# Patient Record
Sex: Female | Born: 2000
Health system: Southern US, Community
[De-identification: ages and names within clinical notes are randomized; demographics above are authoritative.]

## PROBLEM LIST (undated history)

## (undated) DIAGNOSIS — E119 Type 2 diabetes mellitus without complications: Secondary | ICD-10-CM

## (undated) HISTORY — PX: OTHER SURGICAL HISTORY: SHX169

## (undated) HISTORY — PX: HAND SURGERY: SHX662

---
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
## 2022-02-14 ENCOUNTER — Encounter (HOSPITAL_COMMUNITY): Payer: Self-pay | Admitting: Emergency Medicine

## 2022-02-14 ENCOUNTER — Ambulatory Visit (HOSPITAL_COMMUNITY)
Admission: EM | Admit: 2022-02-14 | Discharge: 2022-02-14 | Disposition: A | Discharge status: Home / Self Care | Payer: Medicaid Other | Attending: Internal Medicine | Admitting: Internal Medicine

## 2022-02-14 DIAGNOSIS — A084 Viral intestinal infection, unspecified: Secondary | ICD-10-CM | POA: Insufficient documentation

## 2022-02-14 DIAGNOSIS — N3001 Acute cystitis with hematuria: Secondary | ICD-10-CM | POA: Insufficient documentation

## 2022-02-14 LAB — POCT URINALYSIS DIPSTICK, ED / UC
Bilirubin Urine: NEGATIVE
Glucose, UA: NEGATIVE mg/dL
Ketones, ur: NEGATIVE mg/dL
Nitrite: POSITIVE — AB
Protein, ur: 30 mg/dL — AB
Specific Gravity, Urine: 1.015 (ref 1.005–1.030)
Urobilinogen, UA: 1 mg/dL (ref 0.0–1.0)
pH: 7 (ref 5.0–8.0)

## 2022-02-14 LAB — POC URINE PREG, ED: Preg Test, Ur: NEGATIVE

## 2022-02-14 MED ORDER — ONDANSETRON 4 MG PO TBDP
ORAL_TABLET | ORAL | Status: AC
  Filled 2022-02-14: qty 1

## 2022-02-14 MED ORDER — ONDANSETRON 4 MG PO TBDP
4.0000 mg | ORAL_TABLET | Freq: Once | ORAL | Status: AC
  Administered 2022-02-14: 4 mg via ORAL

## 2022-02-14 MED ORDER — CEPHALEXIN 500 MG PO CAPS: 500.0000 mg | ORAL_CAPSULE | Freq: Three times a day (TID) | ORAL | 0 refills | Status: AC

## 2022-02-14 MED ORDER — ONDANSETRON 4 MG PO TBDP: 4.0000 mg | ORAL_TABLET | Freq: Three times a day (TID) | ORAL | 0 refills | Status: DC | PRN

## 2022-02-14 MED ORDER — ACETAMINOPHEN 500 MG PO TABS: 1000.0000 mg | ORAL_TABLET | Freq: Four times a day (QID) | ORAL | 0 refills | Status: DC | PRN

## 2022-02-14 NOTE — Discharge Instructions (Addendum)
Your urine shows that you have a urinary tract infection. Take Keflex antibiotic 3 times daily for the next 7 days.  Take this medicine with food to avoid stomach upset.  I suspect you also have a viral stomach infection as well. This will improve on its own with rest, fluids, and Zofran antinausea medicine.  We gave you a dose of Zofran in the clinic.  Your next dose of Zofran may be at 9 PM tonight if needed.  You may take 4 mg every 8 hours as needed for nausea and vomiting.   Eat a bland diet as discussed over the next 12 to 24 hours then increase your diet as tolerated. Increase your fluid intake to at least 8 cups of water per day to stay well-hydrated.  You may use Tylenol 1000 mg every 6 hours as needed for any pain you may have.  If you develop any new or worsening symptoms or do not improve in the next 2 to 3 days, please return.  If your symptoms are severe, please go to the emergency room.  Follow-up with your primary care provider for further evaluation and management of your symptoms as well as ongoing wellness visits.  I hope you feel better!

## 2022-02-14 NOTE — ED Provider Notes (Signed)
[facility]    CSN: [id number] Arrival date & time: 02/14/22  1059      History   Chief Complaint No chief complaint on file.   HPI Kelli Sellers is a 21 y.o. female.   Patient presents to urgent care for evaluation of nausea, vomiting, and right upper quadrant abdominal pain that she developed on Friday, February 12, 2022 after eating [employer] at approximately 9 PM.  Patient states that she began to vomit at 5 AM on Saturday morning (yesterday) and has vomited approximately 6-7 times in the last 24 hours.  No bloody or bilious vomit reported.  States her emesis was slightly red, but attributes this to intake of red juice the night prior to vomiting.  Denies intake of uncooked or undercooked meat/raw fruits and vegetables.  No known sick contacts.  No recent abdominal surgeries or antibiotics.  Right upper quadrant abdominal pain comes and goes and is an ache/cramp.  Also reporting right-sided flank pain for the last 2 to 3 days.  She has not been able to keep any liquid down without vomiting and has not attempted to drink anything since yesterday.  Last episode of vomiting was overnight last night.  She has not attempted any over-the-counter medications prior to arrival urgent care for her symptoms.  Also reporting urinary urgency but denies urinary frequency, dysuria, and fever/chills.  States she likes to drink a lot of soda at home and has been trying to increase her water intake.  Last normal bowel movement was 2 days ago and without blood or mucus to the stools.  No recent new sexual partners.  Took 2 pregnancy tests at home both were negative.  No other aggravating or relieving factors identified at this time for patient's symptoms.     History reviewed. No pertinent past medical history.  There are no problems to display for this patient.   Past Surgical History:  Procedure Laterality Date   HAND SURGERY     spider bite infection      OB History   No obstetric  history on file.      Home Medications    Prior to Admission medications   Medication Sig Start Date End Date Taking? Authorizing Provider  acetaminophen (TYLENOL) 500 MG tablet Take 2 tablets (1,000 mg total) by mouth every 6 (six) hours as needed. 02/14/22  Yes Carlisle Beers, FNP  cephALEXin (KEFLEX) 500 MG capsule Take 1 capsule (500 mg total) by mouth 3 (three) times daily for 7 days. 02/14/22 02/21/22 Yes Carlisle Beers, FNP  ondansetron (ZOFRAN-ODT) 4 MG disintegrating tablet Take 1 tablet (4 mg total) by mouth every 8 (eight) hours as needed for nausea or vomiting. 02/14/22  Yes Carlisle Beers, FNP  bacitracin ointment Apply 1 application topically 2 (two) times daily. Patient not taking: Reported on 11/01/2021 11/25/19   Moshe Cipro, NP  cetirizine (ZYRTEC ALLERGY) 10 MG tablet Take 1 tablet (10 mg total) by mouth daily. Patient not taking: Reported on 11/01/2021 11/25/19   Moshe Cipro, NP  cyclobenzaprine (FLEXERIL) 5 MG tablet Take 1 tablet (5 mg total) by mouth at bedtime as needed for muscle spasms. Patient not taking: Reported on 11/01/2021 06/11/20   Lorin Picket, NP  ibuprofen (ADVIL) 400 MG tablet Take 1 tablet (400 mg total) by mouth every 6 (six) hours as needed. 06/11/20   Lorin Picket, NP    Family History Family History  Problem Relation Age of Onset   Healthy Mother  Healthy Father     Social History Social History   Tobacco Use   Smoking status: Never    Passive exposure: Yes   Smokeless tobacco: Never  Vaping Use   Vaping Use: Never used  Substance Use Topics   Alcohol use: Never   Drug use: Yes    Types: Marijuana     Allergies   Patient has no known allergies.   Review of Systems Review of Systems Per HPI  Physical Exam Triage Vital Signs ED Triage Vitals  Enc Vitals Group     BP 02/14/22 1213 (!) 137/92     Pulse Rate 02/14/22 1213 67     Resp 02/14/22 1213 16     Temp 02/14/22 1213 98.3 F (36.8  C)     Temp Source 02/14/22 1213 Oral     SpO2 02/14/22 1213 99 %     Weight --      Height --      Head Circumference --      Peak Flow --      Pain Score 02/14/22 1211 6     Pain Loc --      Pain Edu? --      Excl. in GC? --    No data found.  Updated Vital Signs BP (!) 137/92 (BP Location: Right Arm)   Pulse 67   Temp 98.3 F (36.8 C) (Oral)   Resp 16   SpO2 99%   Visual Acuity Right Eye Distance:   Left Eye Distance:   Bilateral Distance:    Right Eye Near:   Left Eye Near:    Bilateral Near:     Physical Exam Vitals and nursing note reviewed.  Constitutional:      Appearance: Normal appearance. She is ill-appearing. She is not toxic-appearing.     Comments: Very pleasant patient sitting on exam in position of comfort table in no acute distress.   HENT:     Head: Normocephalic and atraumatic.     Right Ear: Hearing, tympanic membrane, ear canal and external ear normal.     Left Ear: Hearing, tympanic membrane, ear canal and external ear normal.     Nose: Nose normal.     Mouth/Throat:     Lips: Pink.     Mouth: Mucous membranes are dry.  Eyes:     General: Lids are normal. Vision grossly intact. Gaze aligned appropriately.     Extraocular Movements: Extraocular movements intact.     Conjunctiva/sclera: Conjunctivae normal.     Pupils: Pupils are equal, round, and reactive to light.  Cardiovascular:     Rate and Rhythm: Normal rate and regular rhythm.     Heart sounds: Normal heart sounds, S1 normal and S2 normal.  Pulmonary:     Effort: Pulmonary effort is normal. No respiratory distress.     Breath sounds: Normal breath sounds and air entry.  Abdominal:     General: Bowel sounds are normal.     Palpations: Abdomen is soft.     Tenderness: There is abdominal tenderness. There is right CVA tenderness. There is no left CVA tenderness or guarding.  Musculoskeletal:     Cervical back: Neck supple.  Skin:    General: Skin is warm and dry.     Capillary  Refill: Capillary refill takes less than 2 seconds.     Findings: No rash.  Neurological:     General: No focal deficit present.     Mental Status: She is alert and  oriented to person, place, and time. Mental status is at baseline.     Cranial Nerves: No dysarthria or facial asymmetry.     Motor: No weakness.     Gait: Gait is intact. Gait normal.     Comments: Skin turgor normal.  Psychiatric:        Mood and Affect: Mood normal.        Speech: Speech normal.        Behavior: Behavior normal.        Thought Content: Thought content normal.        Judgment: Judgment normal.      UC Treatments / Results  Labs (all labs ordered are listed, but only abnormal results are displayed) Labs Reviewed  POCT URINALYSIS DIPSTICK, ED / UC - Abnormal; Notable for the following components:      Result Value   Hgb urine dipstick TRACE (*)    Protein, ur 30 (*)    Nitrite POSITIVE (*)    Leukocytes,Ua MODERATE (*)    All other components within normal limits  URINE CULTURE  POC URINE PREG, ED    EKG   Radiology No results found.  Procedures Procedures (including critical care time)  Medications Ordered in UC Medications  ondansetron (ZOFRAN-ODT) disintegrating tablet 4 mg (4 mg Oral Given 02/14/22 1253)    Initial Impression / Assessment and Plan / UC Course  I have reviewed the triage vital signs and the nursing notes.  Pertinent labs & imaging results that were available during my care of the patient were reviewed by me and considered in my medical decision making (see chart for details).   1.  Acute cystitis with hematuria Patient to take Keflex 3 times daily for the next 7 days to treat acute cystitis with hematuria. Urinalysis nitrite positive with moderate leukocytes and trace hematuria/proteinuria.  Advised patient to take this medicine with food to avoid stomach upset.  Considered covering for possible pyelonephritis, but doubt this as patient is afebrile with  hemodynamically stable vital signs.  Keflex antibiotic will also be easier on patient's stomach and be more tolerable for her.  Urine culture sent.  2.  Viral gastroenteritis Symptoms and physical exam are consistent with viral gastroenteritis etiology.  Suspect that this is separate from acute cystitis although considered possible pyelonephritis etiology.  Patient to use Zofran 4 mg every 8 hours as needed for nausea and vomiting.  Advised patient to intake plenty of liquids initially, then increase diet to bland foods, then increase diet to normal.  She may use 1000 mg of Tylenol every 6 hours as needed for any pain she may have to the right upper quadrant abdomen as I believe this may be related to viral stomach illness.  Advised increase intake of Pedialyte and water/decrease intake of sodas and juices to maintain hydration. Urine pregnancy negative. Patient agreeable with this plan.   Discussed physical exam and available lab work findings in clinic with patient.  Counseled patient regarding appropriate use of medications and potential side effects for all medications recommended or prescribed today. Discussed red flag signs and symptoms of worsening condition,when to call the PCP office, return to urgent care, and when to seek higher level of care in the emergency department. Patient verbalizes understanding and agreement with plan. All questions answered. Patient discharged in stable condition.  Final Clinical Impressions(s) / UC Diagnoses   Final diagnoses:  Acute cystitis with hematuria  Viral gastroenteritis     Discharge Instructions  Your urine shows that you have a urinary tract infection. Take Keflex antibiotic 3 times daily for the next 7 days.  Take this medicine with food to avoid stomach upset.  I suspect you also have a viral stomach infection as well. This will improve on its own with rest, fluids, and Zofran antinausea medicine.  We gave you a dose of Zofran in the  clinic.  Your next dose of Zofran may be at 9 PM tonight if needed.  You may take 4 mg every 8 hours as needed for nausea and vomiting.   Eat a bland diet as discussed over the next 12 to 24 hours then increase your diet as tolerated. Increase your fluid intake to at least 8 cups of water per day to stay well-hydrated.  You may use Tylenol 1000 mg every 6 hours as needed for any pain you may have.  If you develop any new or worsening symptoms or do not improve in the next 2 to 3 days, please return.  If your symptoms are severe, please go to the emergency room.  Follow-up with your primary care provider for further evaluation and management of your symptoms as well as ongoing wellness visits.  I hope you feel better!      ED Prescriptions     Medication Sig Dispense Auth. Provider   ondansetron (ZOFRAN-ODT) 4 MG disintegrating tablet Take 1 tablet (4 mg total) by mouth every 8 (eight) hours as needed for nausea or vomiting. 20 tablet Joella Prince M, FNP   cephALEXin (KEFLEX) 500 MG capsule Take 1 capsule (500 mg total) by mouth 3 (three) times daily for 7 days. 21 capsule Joella Prince M, FNP   acetaminophen (TYLENOL) 500 MG tablet Take 2 tablets (1,000 mg total) by mouth every 6 (six) hours as needed. 30 tablet Talbot Grumbling, FNP      PDMP not reviewed this encounter.   Talbot Grumbling, [place] 02/14/22 1347

## 2022-02-14 NOTE — ED Triage Notes (Addendum)
Friday ate [employer], began having RUQ pain, woke up Saturday morning around 5 am vomiting. Felt bad yesterday, vomited throughout the day yesterday. Pain to RUQ has waxes and wanes in an aching, cramping, shooting pain. When she walks the pain turns sharp and gets worse. Has been taking tylenol without relief, no NSAIDs. Has not had a bowel movement since this started. Denies fever, cough, sore throat, nasal congestion, SOB, changes to bladder habits hematuria, dysuria, alcohol use. Reports she hasn't been able to keep down many foods/fluids. States the pain starts in her back near the CVA and radiates around/through to the RUQ, denies pain in between shoulder blades.

## 2022-02-15 LAB — URINE CULTURE

## 2022-02-16 LAB — URINE CULTURE: Culture: >=100000 — AB

## 2022-05-10 ENCOUNTER — Encounter (HOSPITAL_COMMUNITY): Payer: Self-pay

## 2022-05-10 ENCOUNTER — Ambulatory Visit (HOSPITAL_COMMUNITY)
Admission: EM | Admit: 2022-05-10 | Discharge: 2022-05-10 | Disposition: A | Discharge status: Home / Self Care | Payer: Medicaid Other | Attending: Emergency Medicine | Admitting: Emergency Medicine

## 2022-05-10 DIAGNOSIS — B349 Viral infection, unspecified: Secondary | ICD-10-CM

## 2022-05-10 LAB — POC INFLUENZA A AND B ANTIGEN (URGENT CARE ONLY)
INFLUENZA A ANTIGEN, POC: NEGATIVE
INFLUENZA B ANTIGEN, POC: NEGATIVE

## 2022-05-10 MED ORDER — ACETAMINOPHEN 325 MG PO TABS
ORAL_TABLET | ORAL | Status: AC
  Filled 2022-05-10: qty 3

## 2022-05-10 MED ORDER — ONDANSETRON 4 MG PO TBDP: 4.0000 mg | ORAL_TABLET | Freq: Three times a day (TID) | ORAL | 0 refills | Status: AC | PRN

## 2022-05-10 MED ORDER — ONDANSETRON 4 MG PO TBDP
ORAL_TABLET | ORAL | Status: AC
  Filled 2022-05-10: qty 1

## 2022-05-10 MED ORDER — ACETAMINOPHEN 325 MG PO TABS
975.0000 mg | ORAL_TABLET | Freq: Once | ORAL | Status: AC
  Administered 2022-05-10: 975 mg via ORAL

## 2022-05-10 MED ORDER — ONDANSETRON 4 MG PO TBDP
4.0000 mg | ORAL_TABLET | Freq: Once | ORAL | Status: AC
  Administered 2022-05-10: 4 mg via ORAL

## 2022-05-10 NOTE — ED Triage Notes (Signed)
Pt has been feeling cold , body aches , SOB, chills, possible fever to touch , nausea, bilateral ear pain

## 2022-05-10 NOTE — Discharge Instructions (Signed)
You likely have a virus causing symptoms.  I recommend using ibuprofen alternated with tylenol for body aches, chills, fever, sore throat, etc  Drink lots of fluids!  Use the zofran as needed every 6 hours for nausea  It may take a few days for virus to clear

## 2022-05-10 NOTE — ED Provider Notes (Signed)
[facility]    CSN: [id number] Arrival date & time: 05/10/22  1338     History   Chief Complaint Chief Complaint  Patient presents with   Generalized Body Aches   Shortness of Breath   Chills   Fever   Nausea    HPI Kelli Sellers is a 21 y.o. female.  Presents with body aches, headache, chills, nausea that began yesterday Minor scratchy throat starting today Took tylenol last night that helped, no medications today  Nausea when she tries to eat or drink. Denies vomiting No fever but hot to touch last night per mom  No known sick contacts Home covid test negative   History reviewed. No pertinent past medical history.  There are no problems to display for this patient.   Past Surgical History:  Procedure Laterality Date   HAND SURGERY     spider bite infection      OB History   No obstetric history on file.      Home Medications    Prior to Admission medications   Medication Sig Start Date End Date Taking? Authorizing Provider  acetaminophen (TYLENOL) 500 MG tablet Take 2 tablets (1,000 mg total) by mouth every 6 (six) hours as needed. 02/14/22   Carlisle Beers, FNP  bacitracin ointment Apply 1 application topically 2 (two) times daily. Patient not taking: Reported on 11/01/2021 11/25/19   Moshe Cipro, NP  cetirizine (ZYRTEC ALLERGY) 10 MG tablet Take 1 tablet (10 mg total) by mouth daily. Patient not taking: Reported on 11/01/2021 11/25/19   Moshe Cipro, NP  cyclobenzaprine (FLEXERIL) 5 MG tablet Take 1 tablet (5 mg total) by mouth at bedtime as needed for muscle spasms. Patient not taking: Reported on 11/01/2021 06/11/20   Lorin Picket, NP  ibuprofen (ADVIL) 400 MG tablet Take 1 tablet (400 mg total) by mouth every 6 (six) hours as needed. 06/11/20   Haskins, Jaclyn Prime, NP  ondansetron (ZOFRAN-ODT) 4 MG disintegrating tablet Take 1 tablet (4 mg total) by mouth every 8 (eight) hours as needed for up to 3 days for nausea or  vomiting. 05/10/22 05/13/22  Knight Oelkers, Lurena Joiner PA-C    Family History Family History  Problem Relation Age of Onset   Healthy Mother    Healthy Father     Social History Social History   Tobacco Use   Smoking status: Never    Passive exposure: Yes   Smokeless tobacco: Never  Vaping Use   Vaping Use: Never used  Substance Use Topics   Alcohol use: Never   Drug use: Yes    Types: Marijuana     Allergies   Patient has no known allergies.   Review of Systems Review of Systems Per HPI  Physical Exam Triage Vital Signs ED Triage Vitals  Enc Vitals Group     BP 05/10/22 1521 125/71     Pulse Rate 05/10/22 1521 82     Resp 05/10/22 1521 16     Temp 05/10/22 1521 100 F (37.8 C)     Temp Source 05/10/22 1521 Oral     SpO2 05/10/22 1521 98 %     Weight --      Height --      Head Circumference --      Peak Flow --      Pain Score 05/10/22 1523 3     Pain Loc --      Pain Edu? --      Excl. in GC? --  No data found.  Updated Vital Signs BP 125/71 (BP Location: Left Arm)   Pulse 82   Temp 100 F (37.8 C) (Oral)   Resp 16   LMP 04/28/2022   SpO2 98%    Physical Exam Vitals and nursing note reviewed.  Constitutional:      Appearance: She is not toxic-appearing or diaphoretic.     Comments: Shaking on table  HENT:     Mouth/Throat:     Mouth: Mucous membranes are moist.     Pharynx: Oropharynx is clear. No oropharyngeal exudate or posterior oropharyngeal erythema.  Eyes:     Conjunctiva/sclera: Conjunctivae normal.  Cardiovascular:     Rate and Rhythm: Normal rate and regular rhythm.     Pulses: Normal pulses.     Heart sounds: Normal heart sounds.  Pulmonary:     Effort: Pulmonary effort is normal.     Breath sounds: Normal breath sounds.  Abdominal:     Tenderness: There is no abdominal tenderness. There is no guarding.  Lymphadenopathy:     Cervical: No cervical adenopathy.  Skin:    General: Skin is warm and dry.  Neurological:      Mental Status: She is alert and oriented to person, place, and time.      UC Treatments / Results  Labs (all labs ordered are listed, but only abnormal results are displayed) Labs Reviewed  POC INFLUENZA A AND B ANTIGEN (URGENT CARE ONLY)    EKG   Radiology No results found.  Procedures Procedures (including critical care time)  Medications Ordered in UC Medications  acetaminophen (TYLENOL) tablet 975 mg (975 mg Oral Given 05/10/22 1616)  ondansetron (ZOFRAN-ODT) disintegrating tablet 4 mg (4 mg Oral Given 05/10/22 1617)    Initial Impression / Assessment and Plan / UC Course  I have reviewed the triage vital signs and the nursing notes.  Pertinent labs & imaging results that were available during my care of the patient were reviewed by me and considered in my medical decision making (see chart for details).  Offered flu test - mom and patient agree. Resulted negative  Tylenol given for body aches with improvement in symptoms Zofran for nausea resolved symptoms P.o challenge successful Patient feeling better overall. No longer shaking on exam table  Temp 99.2 at discharge  Discussed viral etiology, symptomatic care Return precautions discussed. Patient agrees to plan  Final Clinical Impressions(s) / UC Diagnoses   Final diagnoses:  Viral illness     Discharge Instructions      You likely have a virus causing symptoms.  I recommend using ibuprofen alternated with tylenol for body aches, chills, fever, sore throat, etc  Drink lots of fluids!  Use the zofran as needed every 6 hours for nausea  It may take a few days for virus to clear     ED Prescriptions     Medication Sig Dispense Auth. Provider   ondansetron (ZOFRAN-ODT) 4 MG disintegrating tablet Take 1 tablet (4 mg total) by mouth every 8 (eight) hours as needed for up to 3 days for nausea or vomiting. 9 tablet Devona Holmes, Lurena Joiner, PA-C      PDMP not reviewed this encounter.   Marlow Baars,  [place] 05/10/22 1655

## 2022-05-11 ENCOUNTER — Ambulatory Visit (HOSPITAL_COMMUNITY)
Admission: EM | Admit: 2022-05-11 | Discharge: 2022-05-11 | Disposition: A | Discharge status: Home / Self Care | Payer: Medicaid Other | Attending: Emergency Medicine | Admitting: Emergency Medicine

## 2022-05-11 ENCOUNTER — Encounter (HOSPITAL_COMMUNITY): Payer: Self-pay | Admitting: *Deleted

## 2022-05-11 DIAGNOSIS — N10 Acute tubulo-interstitial nephritis: Secondary | ICD-10-CM | POA: Diagnosis not present

## 2022-05-11 LAB — POCT URINALYSIS DIPSTICK, ED / UC
Glucose, UA: NEGATIVE mg/dL
Ketones, ur: 40 mg/dL — AB
Nitrite: POSITIVE — AB
Protein, ur: >=300 mg/dL — AB
Specific Gravity, Urine: 1.025 (ref 1.005–1.030)
Urobilinogen, UA: 2 mg/dL — ABNORMAL HIGH (ref 0.0–1.0)
pH: 6 (ref 5.0–8.0)

## 2022-05-11 MED ORDER — CEFTRIAXONE SODIUM 500 MG IJ SOLR
500.0000 mg | INTRAMUSCULAR | Status: DC
  Administered 2022-05-11: 500 mg via INTRAMUSCULAR

## 2022-05-11 MED ORDER — LIDOCAINE HCL (PF) 1 % IJ SOLN
INTRAMUSCULAR | Status: AC
  Filled 2022-05-11: qty 2

## 2022-05-11 MED ORDER — SULFAMETHOXAZOLE-TRIMETHOPRIM 800-160 MG PO TABS: 1.0000 | ORAL_TABLET | Freq: Two times a day (BID) | ORAL | 0 refills | Status: AC

## 2022-05-11 MED ORDER — CEFTRIAXONE SODIUM 500 MG IJ SOLR
INTRAMUSCULAR | Status: AC
  Filled 2022-05-11: qty 500

## 2022-05-11 NOTE — ED Provider Notes (Signed)
[facility]    CSN: [id number] Arrival date & time: 05/11/22  1303      History   Chief Complaint No chief complaint on file.   HPI Kelli Sellers is a 21 y.o. female.  Patient reports flank pain that started yesterday.  Patient endorses nausea, chills, body aches, and fever at home.  She reports 1 episode of emesis this morning.  Patient reports being seen at this clinic yesterday and being diagnosed with a viral illness.  Patient reports symptoms have progressively worsened.  Patient denies any dysuria or changes in her urine characteristics.  Patient denies any vaginal discharge. Patient has taken Zofran for nausea with some relief of symptoms.  Patient has taken Tylenol at home with minimal relief of symptoms.   HPI  History reviewed. No pertinent past medical history.  There are no problems to display for this patient.   Past Surgical History:  Procedure Laterality Date   HAND SURGERY     spider bite infection      OB History   No obstetric history on file.      Home Medications    Prior to Admission medications   Medication Sig Start Date End Date Taking? Authorizing Provider  acetaminophen (TYLENOL) 500 MG tablet Take 2 tablets (1,000 mg total) by mouth every 6 (six) hours as needed. 02/14/22  Yes Carlisle Beers, FNP  ibuprofen (ADVIL) 400 MG tablet Take 1 tablet (400 mg total) by mouth every 6 (six) hours as needed. 06/11/20  Yes Haskins, Kaila R, NP  ondansetron (ZOFRAN-ODT) 4 MG disintegrating tablet Take 1 tablet (4 mg total) by mouth every 8 (eight) hours as needed for up to 3 days for nausea or vomiting. 05/10/22 05/13/22 Yes Rising, Lurena Joiner, PA-C  sulfamethoxazole-trimethoprim (BACTRIM DS) 800-160 MG tablet Take 1 tablet by mouth 2 (two) times daily for 7 days. 05/11/22 05/18/22 Yes Debby Freiberg, NP    Family History Family History  Problem Relation Age of Onset   Healthy Mother    Healthy Father     Social History Social  History   Tobacco Use   Smoking status: Never    Passive exposure: Yes   Smokeless tobacco: Never  Vaping Use   Vaping Use: Never used  Substance Use Topics   Alcohol use: Never   Drug use: Yes    Types: Marijuana     Allergies   Patient has no known allergies.   Review of Systems Review of Systems  Constitutional:  Positive for activity change, appetite change, chills, fatigue and fever.  Respiratory: Negative.    Cardiovascular: Negative.   Gastrointestinal:  Positive for abdominal pain, nausea and vomiting. Negative for abdominal distention, constipation and diarrhea.  Genitourinary:  Positive for flank pain. Negative for decreased urine volume, difficulty urinating, dysuria, enuresis, frequency, genital sores, hematuria, menstrual problem, pelvic pain, urgency, vaginal bleeding, vaginal discharge and vaginal pain.  Neurological:  Positive for headaches.     Physical Exam Triage Vital Signs ED Triage Vitals  Enc Vitals Group     BP 05/11/22 1512 121/84     Pulse Rate 05/11/22 1512 83     Resp 05/11/22 1512 18     Temp 05/11/22 1512 99.3 F (37.4 C)     Temp Source 05/11/22 1512 Oral     SpO2 05/11/22 1512 96 %     Weight --      Height --      Head Circumference --  Peak Flow --      Pain Score 05/11/22 1511 6     Pain Loc --      Pain Edu? --      Excl. in GC? --    No data found.  Updated Vital Signs BP 121/84 (BP Location: Right Arm)   Pulse 83   Temp 99.3 F (37.4 C) (Oral)   Resp 18   LMP 04/28/2022   SpO2 96%   Physical Exam Vitals and nursing note reviewed.  Constitutional:      Appearance: Normal appearance. She is ill-appearing.  Abdominal:     General: Abdomen is flat. Bowel sounds are normal. There is no distension. There are no signs of injury.     Palpations: Abdomen is soft. There is no shifting dullness, fluid wave, hepatomegaly, splenomegaly, mass or pulsatile mass.     Tenderness: There is abdominal tenderness in the right  upper quadrant, right lower quadrant and suprapubic area. There is right CVA tenderness. There is no left CVA tenderness, guarding or rebound. Negative signs include Murphy's sign and McBurney's sign.  Neurological:     Mental Status: She is alert.      UC Treatments / Results  Labs (all labs ordered are listed, but only abnormal results are displayed) Labs Reviewed  POCT URINALYSIS DIPSTICK, ED / UC - Abnormal; Notable for the following components:      Result Value   Bilirubin Urine MODERATE (*)    Ketones, ur 40 (*)    Hgb urine dipstick LARGE (*)    Protein, ur >=300 (*)    Urobilinogen, UA 2.0 (*)    Nitrite POSITIVE (*)    Leukocytes,Ua LARGE (*)    All other components within normal limits  URINE CULTURE    EKG   Radiology No results found.  Procedures Procedures (including critical care time)  Medications Ordered in UC Medications  cefTRIAXone (ROCEPHIN) injection 500 mg (500 mg Intramuscular Given 05/11/22 1617)    Initial Impression / Assessment and Plan / UC Course  I have reviewed the triage vital signs and the nursing notes.  Pertinent labs & imaging results that were available during my care of the patient were reviewed by me and considered in my medical decision making (see chart for details).     Patient was evaluated for acute pyelonephritis today.  Due to symptomology with systemic symptoms, physical assessment, and urinalysis etiology appears to be based on urinary tract infection with pyelonephritis .  Urinalysis showed moderate bilirubin, 40 of ketones, large blood, greater than 300 of protein, 2.0 urobilinogen, positive nitrites and large leukocytes. Rocephin given IM in office. Bactrim prescription sent to pharmacy. Patient made aware of treatment regiment and possible side effects.  Patient made aware to follow-up if symptoms not improving with treatment regiment.  Patient made aware of red flag symptoms that would warrant an emergency department  visit.  Patient verbalized understanding of instructions. Final Clinical Impressions(s) / UC Diagnoses   Final diagnoses:  Acute pyelonephritis     Discharge Instructions      We warrant a change in your plan of care, these results will be on your MyChart.   We gave you a Rocephin injection in office today.  Bactrim has been sent to the pharmacy, you will take this medication 2 times a day for the next 7 days.  Please follow-up if symptoms are not improving.   As discussed, if you begin having worsening symptoms at home please go to the nearest  emergency department,      ED Prescriptions     Medication Sig Dispense Auth. Provider   sulfamethoxazole-trimethoprim (BACTRIM DS) 800-160 MG tablet Take 1 tablet by mouth 2 (two) times daily for 7 days. 14 tablet Debby Freiberg, NP      PDMP not reviewed this encounter.   Debby Freiberg, NP 05/11/22 1704

## 2022-05-11 NOTE — Discharge Instructions (Addendum)
We warrant a change in your plan of care, these results will be on your MyChart.   We gave you a Rocephin injection in office today.  Bactrim has been sent to the pharmacy, you will take this medication 2 times a day for the next 7 days.  Please follow-up if symptoms are not improving.   As discussed, if you begin having worsening symptoms at home please go to the nearest emergency department,

## 2022-05-11 NOTE — ED Triage Notes (Signed)
Pt states that she was seen yesterday and has been taking the tylenol with pain relief. She was having the right sided lower abdominal pain yesterday but didn't mention it.

## 2022-05-14 LAB — URINE CULTURE
Culture: >=100000 — AB
Special Requests: NORMAL

## 2022-09-21 ENCOUNTER — Encounter (HOSPITAL_COMMUNITY): Payer: Self-pay

## 2022-09-21 ENCOUNTER — Emergency Department (HOSPITAL_COMMUNITY)
Admission: EM | Admit: 2022-09-21 | Discharge: 2022-09-21 | Disposition: A | Discharge status: Home / Self Care | Payer: Medicaid Other | Attending: Emergency Medicine | Admitting: Emergency Medicine

## 2022-09-21 ENCOUNTER — Other Ambulatory Visit: Payer: Self-pay

## 2022-09-21 DIAGNOSIS — R112 Nausea with vomiting, unspecified: Secondary | ICD-10-CM | POA: Diagnosis not present

## 2022-09-21 LAB — BASIC METABOLIC PANEL
Anion gap: 7 (ref 5–15)
BUN: 7 mg/dL (ref 6–20)
CO2: 25 mmol/L (ref 22–32)
Calcium: 8.8 mg/dL — ABNORMAL LOW (ref 8.9–10.3)
Chloride: 105 mmol/L (ref 98–111)
Creatinine, Ser: 0.69 mg/dL (ref 0.44–1.00)
GFR, Estimated: >60 mL/min (ref >60)
Glucose, Bld: 92 mg/dL (ref 70–99)
Potassium: 3.3 mmol/L — ABNORMAL LOW (ref 3.5–5.1)
Sodium: 137 mmol/L (ref 135–145)

## 2022-09-21 LAB — CBC WITH DIFFERENTIAL/PLATELET
Abs Immature Granulocytes: 0.02 10*3/uL (ref 0.00–0.07)
Basophils Absolute: 0 10*3/uL (ref 0.0–0.1)
Basophils Relative: 0 %
Eosinophils Absolute: 0.1 10*3/uL (ref 0.0–0.5)
Eosinophils Relative: 1 %
HCT: 36.8 % (ref 36.0–46.0)
Hemoglobin: 12.1 g/dL (ref 12.0–15.0)
Immature Granulocytes: 0 %
Lymphocytes Relative: 18 %
Lymphs Abs: 1.3 10*3/uL (ref 0.7–4.0)
MCH: 28.5 pg (ref 26.0–34.0)
MCHC: 32.9 g/dL (ref 30.0–36.0)
MCV: 86.8 fL (ref 80.0–100.0)
Monocytes Absolute: 0.4 10*3/uL (ref 0.1–1.0)
Monocytes Relative: 5 %
Neutro Abs: 5.3 10*3/uL (ref 1.7–7.7)
Neutrophils Relative %: 76 %
Platelets: 231 10*3/uL (ref 150–400)
RBC: 4.24 MIL/uL (ref 3.87–5.11)
RDW: 12.9 % (ref 11.5–15.5)
WBC: 7.1 10*3/uL (ref 4.0–10.5)
nRBC: 0 % (ref 0.0–0.2)

## 2022-09-21 LAB — LIPASE, BLOOD: Lipase: 23 U/L (ref 11–51)

## 2022-09-21 MED ORDER — ONDANSETRON HCL 4 MG PO TABS: 4.0000 mg | ORAL_TABLET | Freq: Three times a day (TID) | ORAL | 0 refills | Status: DC | PRN

## 2022-09-21 MED ORDER — ONDANSETRON HCL 4 MG/2ML IJ SOLN
4.0000 mg | Freq: Once | INTRAMUSCULAR | Status: AC
  Administered 2022-09-21: 4 mg via INTRAVENOUS
  Filled 2022-09-21: qty 2

## 2022-09-21 MED ORDER — SODIUM CHLORIDE 0.9 % IV BOLUS
1000.0000 mL | Freq: Once | INTRAVENOUS | Status: AC
  Administered 2022-09-21: 1000 mL via INTRAVENOUS

## 2022-09-21 NOTE — ED Provider Notes (Signed)
[place] EMERGENCY DEPARTMENT AT [facility] Provider Note   CSN: [id number] Arrival date & time: 09/21/22  0455     History  Chief Complaint  Patient presents with   Vomiting   Abdominal Pain    Kelli Sellers is a 22 y.o. female.  Patient presents to the emergency department complaining of nausea and vomiting that began a few hours prior to arrival.  She states she has vomited approximate 4-5 times.  She complains of generalized abdominal discomfort.  She denies diarrhea, vaginal discharge, dysuria, shortness of breath, chest pain, fever.  She is here with her boyfriend and 1 other person who both have similar symptoms.  Past medical history noncontributory  HPI     Home Medications Prior to Admission medications   Medication Sig Start Date End Date Taking? Authorizing Provider  ondansetron (ZOFRAN) 4 MG tablet Take 1 tablet (4 mg total) by mouth every 8 (eight) hours as needed for nausea or vomiting. 09/21/22  Yes Dorothyann Peng, PA-C  acetaminophen (TYLENOL) 500 MG tablet Take 2 tablets (1,000 mg total) by mouth every 6 (six) hours as needed. 02/14/22   Talbot Grumbling, FNP  ibuprofen (ADVIL) 400 MG tablet Take 1 tablet (400 mg total) by mouth every 6 (six) hours as needed. 06/11/20   Griffin Basil, NP      Allergies    Patient has no known allergies.    Review of Systems   Review of Systems  Physical Exam Updated Vital Signs BP 118/73   Pulse 61   Temp 98.2 F (36.8 C) (Oral)   Resp 18   LMP 08/31/2022 (Approximate)   SpO2 100%  Physical Exam Vitals and nursing note reviewed.  Constitutional:      General: She is not in acute distress.    Appearance: She is well-developed.  HENT:     Head: Normocephalic and atraumatic.  Eyes:     Conjunctiva/sclera: Conjunctivae normal.  Cardiovascular:     Rate and Rhythm: Normal rate and regular rhythm.     Heart sounds: No murmur heard. Pulmonary:     Effort: Pulmonary effort is normal. No  respiratory distress.     Breath sounds: Normal breath sounds.  Abdominal:     Palpations: Abdomen is soft.     Tenderness: There is no abdominal tenderness.  Musculoskeletal:        General: No swelling.     Cervical back: Neck supple.  Skin:    General: Skin is warm and dry.     Capillary Refill: Capillary refill takes less than 2 seconds.  Neurological:     Mental Status: She is alert.  Psychiatric:        Mood and Affect: Mood normal.     ED Results / Procedures / Treatments   Labs (all labs ordered are listed, but only abnormal results are displayed) Labs Reviewed  BASIC METABOLIC PANEL - Abnormal; Notable for the following components:      Result Value   Potassium 3.3 (*)    Calcium 8.8 (*)    All other components within normal limits  CBC WITH DIFFERENTIAL/PLATELET  LIPASE, BLOOD  URINALYSIS, ROUTINE W REFLEX MICROSCOPIC    EKG None  Radiology No results found.  Procedures Procedures    Medications Ordered in ED Medications  sodium chloride 0.9 % bolus 1,000 mL (1,000 mLs Intravenous New Bag/Given 09/21/22 0539)  ondansetron (ZOFRAN) injection 4 mg (4 mg Intravenous Given 09/21/22 0538)    ED Course/  Medical Decision Making/ A&P                             Medical Decision Making Amount and/or Complexity of Data Reviewed Labs: ordered.  Risk Prescription drug management.   This patient presents to the ED for concern of nausea, vomiting, this involves an extensive number of treatment options, and is a complaint that carries with it a high risk of complications and morbidity.  The differential diagnosis includes a viral infection, gastritis, cholecystitis, appendicitis, others   Co morbidities that complicate the patient evaluation  None   Additional history obtained:   External records from outside source obtained and reviewed including urgent care notes from November of this past year when the patient was diagnosed with acute  pyelonephritis   Lab Tests:  I Ordered, and personally interpreted labs.  The pertinent results include:  Labs unremarkable   Imaging Studies ordered:  The patient has no abdominal tenderness on palpation.  No signs of surgical/acute abdomen.  I see no indication for abdominal imaging at this time  Problem List / ED Course / Critical interventions / Medication management   I ordered medication including saline for fluid resuscitation, Zofran for nausea Reevaluation of the patient after these medicines showed that the patient improved I have reviewed the patients home medicines and have made adjustments as needed   Social Determinants of Health:  Patient has Medicaid for primary insurance   Test / Admission - Considered:  Patient has symptoms consistent with a viral illness, especially in the setting of multiple household members with similar symptoms.  Plan to discharge with prescription for Zofran.  No abdominal tenderness to suggest surgical abdomen/acute abdomen.  No sepsis.  Vitals are normal.        Final Clinical Impression(s) / ED Diagnoses Final diagnoses:  Nausea and vomiting, unspecified vomiting type    Rx / DC Orders ED Discharge Orders          Ordered    ondansetron (ZOFRAN) 4 MG tablet  Every 8 hours PRN        09/21/22 [place], Maurion Walkowiak B, PA-C 09/21/22 0633    [place], Jenny Reichmann, MD 09/21/22 [phone]

## 2022-09-21 NOTE — ED Notes (Signed)
Patient unable to give urine sample at this time

## 2022-09-21 NOTE — ED Notes (Signed)
Pt verbalized understanding to discharge instructions. Pt ambulated from ED with steady gait.

## 2022-09-21 NOTE — Discharge Instructions (Addendum)
You were evaluated today for nausea and vomiting.  This seems likely to be due to a viral illness.  Please take the prescribed medication for nausea.  Please drink fluids as you are able.

## 2022-09-21 NOTE — ED Triage Notes (Signed)
Pt c/o n/v that started a few hours. States that she has vomited 4-5 times. Also c/o generalized abdominal pain. Denies meds PTA. States that her boyfriend is having similar symptoms and she thought it was from the [employer] that they ate yesterday.

## 2022-12-23 ENCOUNTER — Ambulatory Visit (HOSPITAL_COMMUNITY)
Admission: EM | Admit: 2022-12-23 | Discharge: 2022-12-23 | Disposition: A | Discharge status: Home / Self Care | Payer: Medicaid Other | Attending: Internal Medicine | Admitting: Internal Medicine

## 2022-12-23 ENCOUNTER — Other Ambulatory Visit: Payer: Self-pay

## 2022-12-23 ENCOUNTER — Encounter (HOSPITAL_COMMUNITY): Payer: Self-pay | Admitting: Emergency Medicine

## 2022-12-23 DIAGNOSIS — A084 Viral intestinal infection, unspecified: Secondary | ICD-10-CM | POA: Diagnosis not present

## 2022-12-23 MED ORDER — ONDANSETRON 4 MG PO TBDP: 4.0000 mg | ORAL_TABLET | Freq: Three times a day (TID) | ORAL | 0 refills | Status: DC | PRN

## 2022-12-23 MED ORDER — ONDANSETRON 4 MG PO TBDP
4.0000 mg | ORAL_TABLET | Freq: Once | ORAL | Status: AC
  Administered 2022-12-23: 4 mg via ORAL

## 2022-12-23 MED ORDER — ONDANSETRON 4 MG PO TBDP
ORAL_TABLET | ORAL | Status: AC
  Filled 2022-12-23: qty 1

## 2022-12-23 NOTE — ED Triage Notes (Addendum)
Reports abdominal pain started in the middle of the night.  Patient complains of nausea and vomiting.  Patient denies diarrhea.  Patient reports 5 episodes of vomiting today.   Patient has not taken any medications.  Has been trying to drink water.  Patient is texting someone on phone during intake process.  Reports pain is left lower abdomen and past normal BM was 3 days ago

## 2022-12-23 NOTE — Discharge Instructions (Addendum)
Your symptoms sound like viral gastroenteritis Take zofran as needed for nausea or vomiting, you place this medication under the tongue. Take small sips of water, pedialyte, gatorade until tolerating. Eat BLAND foods - bananas, rice, apples, toast, boiled chicken, saltine crackers. Avoid spicy or greasy foods.  If you develop loose stools, try to remain hydrated, but avoid OTC antidiarrheals. Pepto bismol or Gas-x can help with stomach cramping.

## 2022-12-23 NOTE — ED Provider Notes (Signed)
[facility]    CSN: [id number] Arrival date & time: 12/23/22  1700      History   Chief Complaint Chief Complaint  Patient presents with   Abdominal Pain    HPI Kelli Sellers is a 22 y.o. female.   Pleasant 22 year old female presents today due to concerns of nausea and vomiting.  She also reports generalized abdominal discomfort and cramping.  Around 6 PM last evening she states she ate a cheeseburger from [employer].  Around 2 AM in the morning, she was woken up with cramping abdominal pain and vomiting.  Since 2 AM this morning, she states she has vomited 6 times. She is nauseated in the room, but zofran helped with some improvement. Denies pelvic pain, vaginal discharge, urinary frequency, dysuria. Pt denies constipation or diarrhea. LNMP one week ago. Denies fever or systemic symptoms.   Abdominal Pain   History reviewed. No pertinent past medical history.  There are no problems to display for this patient.   Past Surgical History:  Procedure Laterality Date   HAND SURGERY     spider bite infection      OB History   No obstetric history on file.      Home Medications    Prior to Admission medications   Medication Sig Start Date End Date Taking? Authorizing Provider  ondansetron (ZOFRAN-ODT) 4 MG disintegrating tablet Take 1 tablet (4 mg total) by mouth every 8 (eight) hours as needed for nausea or vomiting. 12/23/22  Yes Carmisha Larusso L, PA  acetaminophen (TYLENOL) 500 MG tablet Take 2 tablets (1,000 mg total) by mouth every 6 (six) hours as needed. 02/14/22   Carlisle Beers, FNP  ibuprofen (ADVIL) 400 MG tablet Take 1 tablet (400 mg total) by mouth every 6 (six) hours as needed. 06/11/20   Lorin Picket, NP    Family History Family History  Problem Relation Age of Onset   Healthy Mother    Healthy Father     Social History Social History   Tobacco Use   Smoking status: Never    Passive exposure: Yes   Smokeless tobacco: Never   Vaping Use   Vaping Use: Never used  Substance Use Topics   Alcohol use: Never   Drug use: Yes    Types: Marijuana     Allergies   Patient has no known allergies.   Review of Systems Review of Systems  Gastrointestinal:  Positive for abdominal pain.  As per HPI   Physical Exam Triage Vital Signs ED Triage Vitals  Enc Vitals Group     BP 12/23/22 1746 126/82     Pulse Rate 12/23/22 1746 66     Resp 12/23/22 1746 18     Temp 12/23/22 1746 98.4 F (36.9 C)     Temp Source 12/23/22 1746 Oral     SpO2 12/23/22 1746 95 %     Weight --      Height --      Head Circumference --      Peak Flow --      Pain Score 12/23/22 1743 10     Pain Loc --      Pain Edu? --      Excl. in GC? --    No data found.  Updated Vital Signs BP 126/82 (BP Location: Left Arm)   Pulse 66   Temp 98.4 F (36.9 C) (Oral)   Resp 18   LMP 12/09/2022   SpO2 95%   Visual  Acuity Right Eye Distance:   Left Eye Distance:   Bilateral Distance:    Right Eye Near:   Left Eye Near:    Bilateral Near:     Physical Exam Vitals and nursing note reviewed.  Constitutional:      General: She is not in acute distress.    Appearance: She is well-developed and normal weight. She is not ill-appearing, toxic-appearing or diaphoretic.     Comments: Pt resting on bed, smiling, NAD  HENT:     Head: Normocephalic and atraumatic.     Mouth/Throat:     Mouth: Mucous membranes are moist.     Pharynx: Oropharynx is clear. No pharyngeal swelling or oropharyngeal exudate.  Eyes:     General: No scleral icterus.    Extraocular Movements: Extraocular movements intact.     Pupils: Pupils are equal, round, and reactive to light.  Cardiovascular:     Rate and Rhythm: Normal rate and regular rhythm.     Heart sounds: Normal heart sounds. No murmur heard.    No friction rub. No gallop.  Pulmonary:     Effort: Pulmonary effort is normal. No respiratory distress.     Breath sounds: Normal breath sounds. No  stridor. No wheezing, rhonchi or rales.  Chest:     Chest wall: No tenderness.  Abdominal:     General: Abdomen is flat. Bowel sounds are normal. There is no distension.     Palpations: Abdomen is soft. There is no hepatomegaly or splenomegaly.     Tenderness: There is generalized abdominal tenderness (mild generalized cramping per pt; No rebound tenderness or signs of acute abdomen). There is no right CVA tenderness, left CVA tenderness, guarding or rebound. Negative signs include Murphy's sign, Rovsing's sign, McBurney's sign and psoas sign.     Hernia: No hernia is present.  Skin:    General: Skin is warm and dry.     Capillary Refill: Capillary refill takes less than 2 seconds.     Coloration: Skin is not cyanotic, jaundiced or mottled.     Findings: No erythema or rash.  Neurological:     General: No focal deficit present.     Mental Status: She is alert and oriented to person, place, and time.  Psychiatric:        Behavior: Behavior normal.      UC Treatments / Results  Labs (all labs ordered are listed, but only abnormal results are displayed) Labs Reviewed - No data to display  EKG   Radiology No results found.  Procedures Procedures (including critical care time)  Medications Ordered in UC Medications  ondansetron (ZOFRAN-ODT) disintegrating tablet 4 mg (4 mg Oral Given 12/23/22 1750)    Initial Impression / Assessment and Plan / UC Course  I have reviewed the triage vital signs and the nursing notes.  Pertinent labs & imaging results that were available during my care of the patient were reviewed by me and considered in my medical decision making (see chart for details).     VGE - pt with n/v since 2am after eating at [facility]. Some mild abdominal cramping which improved with zofran use. Pt denies red flag s/sx. Appears non-toxic, VSS. Discussed BRAT diet and other supportive measures. Pt with moist mucous membranes and no skin tenting. RTC/ER precautions  discussed.    Final Clinical Impressions(s) / UC Diagnoses   Final diagnoses:  Viral gastroenteritis     Discharge Instructions      Your symptoms sound like viral gastroenteritis  Take zofran as needed for nausea or vomiting, you place this medication under the tongue. Take small sips of water, pedialyte, gatorade until tolerating. Eat BLAND foods - bananas, rice, apples, toast, boiled chicken, saltine crackers. Avoid spicy or greasy foods.  If you develop loose stools, try to remain hydrated, but avoid OTC antidiarrheals. Pepto bismol or Gas-x can help with stomach cramping.     ED Prescriptions     Medication Sig Dispense Auth. Provider   ondansetron (ZOFRAN-ODT) 4 MG disintegrating tablet Take 1 tablet (4 mg total) by mouth every 8 (eight) hours as needed for nausea or vomiting. 20 tablet Nakyla Bracco L, [place]      PDMP not reviewed this encounter.   Maretta Bees, [place] 12/23/22 2052

## 2023-06-07 ENCOUNTER — Encounter (HOSPITAL_COMMUNITY): Payer: Self-pay | Admitting: Emergency Medicine

## 2023-10-15 ENCOUNTER — Ambulatory Visit (HOSPITAL_COMMUNITY): Payer: Self-pay

## 2023-10-16 ENCOUNTER — Ambulatory Visit (HOSPITAL_COMMUNITY): Payer: Self-pay

## 2023-11-07 ENCOUNTER — Other Ambulatory Visit: Payer: Self-pay

## 2023-11-07 ENCOUNTER — Emergency Department (HOSPITAL_COMMUNITY)

## 2023-11-07 ENCOUNTER — Encounter (HOSPITAL_COMMUNITY): Payer: Self-pay | Admitting: *Deleted

## 2023-11-07 ENCOUNTER — Emergency Department (HOSPITAL_COMMUNITY)
Admission: EM | Admit: 2023-11-07 | Discharge: 2023-11-07 | Disposition: A | Discharge status: Home / Self Care | Attending: Student | Admitting: Student

## 2023-11-07 DIAGNOSIS — Y92410 Unspecified street and highway as the place of occurrence of the external cause: Secondary | ICD-10-CM | POA: Insufficient documentation

## 2023-11-07 DIAGNOSIS — M542 Cervicalgia: Secondary | ICD-10-CM | POA: Diagnosis not present

## 2023-11-07 DIAGNOSIS — R519 Headache, unspecified: Secondary | ICD-10-CM | POA: Insufficient documentation

## 2023-11-07 DIAGNOSIS — M25561 Pain in right knee: Secondary | ICD-10-CM | POA: Insufficient documentation

## 2023-11-07 DIAGNOSIS — R079 Chest pain, unspecified: Secondary | ICD-10-CM | POA: Diagnosis not present

## 2023-11-07 MED ORDER — NAPROXEN 500 MG PO TABS: 500.0000 mg | ORAL_TABLET | Freq: Two times a day (BID) | ORAL | 0 refills | Status: DC

## 2023-11-07 MED ORDER — METHOCARBAMOL 500 MG PO TABS: 500.0000 mg | ORAL_TABLET | Freq: Two times a day (BID) | ORAL | 0 refills | Status: DC
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
## 2023-11-07 NOTE — Discharge Instructions (Signed)
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
## 2023-12-02 ENCOUNTER — Encounter (HOSPITAL_COMMUNITY): Payer: Self-pay

## 2023-12-02 ENCOUNTER — Ambulatory Visit (HOSPITAL_COMMUNITY)
Admission: EM | Admit: 2023-12-02 | Discharge: 2023-12-02 | Disposition: A | Discharge status: Home / Self Care | Attending: Internal Medicine | Admitting: Internal Medicine

## 2023-12-02 DIAGNOSIS — Z3202 Encounter for pregnancy test, result negative: Secondary | ICD-10-CM | POA: Insufficient documentation

## 2023-12-02 DIAGNOSIS — N921 Excessive and frequent menstruation with irregular cycle: Secondary | ICD-10-CM | POA: Diagnosis present

## 2023-12-02 LAB — BASIC METABOLIC PANEL WITH GFR
Anion gap: 12 (ref 5–15)
BUN: 6 mg/dL (ref 6–20)
CO2: 25 mmol/L (ref 22–32)
Calcium: 9.4 mg/dL (ref 8.9–10.3)
Chloride: 103 mmol/L (ref 98–111)
Creatinine, Ser: 0.71 mg/dL (ref 0.44–1.00)
GFR, Estimated: >60 mL/min (ref >60)
Glucose, Bld: 81 mg/dL (ref 70–99)
Potassium: 3.8 mmol/L (ref 3.5–5.1)
Sodium: 140 mmol/L (ref 135–145)

## 2023-12-02 LAB — CBC
HCT: 26.9 % — ABNORMAL LOW (ref 36.0–46.0)
Hemoglobin: 8.5 g/dL — ABNORMAL LOW (ref 12.0–15.0)
MCH: 25.6 pg — ABNORMAL LOW (ref 26.0–34.0)
MCHC: 31.6 g/dL (ref 30.0–36.0)
MCV: 81 fL (ref 80.0–100.0)
Platelets: 323 10*3/uL (ref 150–400)
RBC: 3.32 MIL/uL — ABNORMAL LOW (ref 3.87–5.11)
RDW: 13.9 % (ref 11.5–15.5)
WBC: 4.8 10*3/uL (ref 4.0–10.5)
nRBC: 0 % (ref 0.0–0.2)

## 2023-12-02 LAB — POCT URINE PREGNANCY: Preg Test, Ur: NEGATIVE

## 2023-12-02 MED ORDER — FERROUS SULFATE 325 (65 FE) MG PO TABS: 325.0000 mg | ORAL_TABLET | Freq: Every day | ORAL | 1 refills | Status: DC

## 2023-12-02 MED ORDER — MEDROXYPROGESTERONE ACETATE 10 MG PO TABS: 10.0000 mg | ORAL_TABLET | Freq: Every day | ORAL | 0 refills | Status: DC

## 2023-12-02 NOTE — ED Provider Notes (Addendum)
 [facility]    CSN: [id number] Arrival date & time: 12/02/23  0818      History   Chief Complaint Chief Complaint  Patient presents with   Vaginal Bleeding    HPI Kelli Sellers is a 23 y.o. female.   Kelli Sellers is a 23 y.o. female presenting for chief complaint of irregular vaginal bleeding.  She had a menstrual cycle starting on May 2 through May 7, then no bleeding for 2 weeks until her menstrual cycle started again on May 22 and has not stopped in the last 13 days.  She reports heavy menstrual bleeding with dime sized clots.  She is changing her pad/tampon every 2 hours consistently and reports some dizziness with standing as well as generalized fatigue and intermittent generalized headaches.  She is experiencing intermittent abdominal cramping associated with vaginal bleeding but states these feel like her normal period cramps.  Denies syncope, chest pain, shortness of breath, and history of anemia.  No urinary symptoms, flank pain, or concern for STD.  She has irregular menstrual cycles at baseline and has never been seen by an OB/GYN for evaluation of irregular menses/menorrhagia.  This is the first time vaginal bleeding has lasted this long.  She does not take OCPs.     Vaginal Bleeding   History reviewed. No pertinent past medical history.  There are no active problems to display for this patient.   Past Surgical History:  Procedure Laterality Date   HAND SURGERY     spider bite infection      OB History   No obstetric history on file.      Home Medications    Prior to Admission medications   Medication Sig Start Date End Date Taking? Authorizing Provider  ferrous sulfate 325 (65 FE) MG tablet Take 1 tablet (325 mg total) by mouth daily. 12/02/23  Yes Starlene Eaton, FNP  medroxyPROGESTERone (PROVERA) 10 MG tablet Take 1 tablet (10 mg total) by mouth daily for 10 days. 12/02/23 12/12/23 Yes StanhopeDanny Dye, FNP  acetaminophen   (TYLENOL ) 500 MG tablet Take 2 tablets (1,000 mg total) by mouth every 6 (six) hours as needed. 02/14/22   Starlene Eaton, FNP    Family History Family History  Problem Relation Age of Onset   Healthy Mother    Healthy Father     Social History Social History   Tobacco Use   Smoking status: Never    Passive exposure: Yes   Smokeless tobacco: Never  Vaping Use   Vaping status: Never Used  Substance Use Topics   Alcohol use: Never   Drug use: Yes    Types: Marijuana     Allergies   Patient has no known allergies.   Review of Systems Review of Systems  Genitourinary:  Positive for vaginal bleeding.  Per HPI  Physical Exam Triage Vital Signs ED Triage Vitals [12/02/23 0916]  Encounter Vitals Group     BP 116/75     Systolic BP Percentile      Diastolic BP Percentile      Pulse Rate 65     Resp 14     Temp 98.4 F (36.9 C)     Temp Source Oral     SpO2 95 %     Weight      Height      Head Circumference      Peak Flow      Pain Score 0     Pain Loc  Pain Education      Exclude from Growth Chart    No data found.  Updated Vital Signs BP 116/75 (BP Location: Left Arm)   Pulse 65   Temp 98.4 F (36.9 C) (Oral)   Resp 14   LMP 11/17/2023 (Exact Date)   SpO2 95%   Visual Acuity Right Eye Distance:   Left Eye Distance:   Bilateral Distance:    Right Eye Near:   Left Eye Near:    Bilateral Near:     Physical Exam Vitals and nursing note reviewed.  Constitutional:      Appearance: She is not ill-appearing or toxic-appearing.  HENT:     Head: Normocephalic and atraumatic.     Right Ear: Hearing and external ear normal.     Left Ear: Hearing and external ear normal.     Nose: Nose normal.     Mouth/Throat:     Lips: Pink.     Mouth: Mucous membranes are moist.     Pharynx: No posterior oropharyngeal erythema.  Eyes:     General: Lids are normal. Vision grossly intact. Gaze aligned appropriately.     Extraocular Movements:  Extraocular movements intact.     Conjunctiva/sclera: Conjunctivae normal.     Comments: No subconjunctival pallor.  Pulmonary:     Effort: Pulmonary effort is normal.  Abdominal:     General: Bowel sounds are normal.     Palpations: Abdomen is soft.     Tenderness: There is no abdominal tenderness. There is no right CVA tenderness, left CVA tenderness or guarding.  Musculoskeletal:     Cervical back: Neck supple.  Skin:    General: Skin is warm and dry.     Capillary Refill: Capillary refill takes less than 2 seconds.     Findings: No rash.  Neurological:     General: No focal deficit present.     Mental Status: She is alert and oriented to person, place, and time. Mental status is at baseline.     Cranial Nerves: No dysarthria or facial asymmetry.  Psychiatric:        Mood and Affect: Mood normal.        Speech: Speech normal.        Behavior: Behavior normal.        Thought Content: Thought content normal.        Judgment: Judgment normal.      UC Treatments / Results  Labs (all labs ordered are listed, but only abnormal results are displayed) Labs Reviewed  CBC  BASIC METABOLIC PANEL WITH GFR  POCT URINE PREGNANCY    EKG   Radiology No results found.  Procedures Procedures (including critical care time)  Medications Ordered in UC Medications - No data to display  Initial Impression / Assessment and Plan / UC Course  I have reviewed the triage vital signs and the nursing notes.  Pertinent labs & imaging results that were available during my care of the patient were reviewed by me and considered in my medical decision making (see chart for details).   1.  Menorrhagia with irregular cycle, urine pregnancy test negative Urine pregnancy test is negative. Unclear causes of persistent vaginal bleeding, differential includes endometriosis, uterine leiomyoma, etc. No signs of dehydration or severe anemia on exam. Vital signs are hemodynamically stable, no  hypotension or tachycardia. CBC and BMP drawn to assess for electrolyte imbalance/anemia contributing to dizziness and fatigue. Staff will call if blood work is abnormal. I would like for  her to start taking Provera 10 mg once daily for the next 10 days to slow down vaginal bleeding as well as ferrous sulfate 325 mg daily for iron repletion.  Recommend follow-up with OB/GYN via walking referral for intravaginal ultrasound for further workup and evaluation.  Counseled patient on potential for adverse effects with medications prescribed/recommended today, strict ER and return-to-clinic precautions discussed, patient verbalized understanding.    Final Clinical Impressions(s) / UC Diagnoses   Final diagnoses:  Menorrhagia with irregular cycle  Urine pregnancy test negative     Discharge Instructions      Take provera 10mg  once daily to slow down vaginal bleeding.  Take 1 pill of ferrous sulfate (iron) daily.  Blood work has been drawn today, staff will call if blood work is abnormal. Please schedule a follow-up appointment with an OB/GYN listed below for further evaluation and imaging to rule out uterine abnormalities.  Center for [facility]            [address], [place], [place] [address] [phone]   Center for [facility] at [facility]                                                            [address], [place], [place], [address] [phone]  If your dizziness worsens, or if the provera does not help with vaginal bleeding, please return to urgent care. If you become short of breath, pass out, or develop any new/worsening severe symptoms, please go to the ER.  Follow-up with OB/GYN. I hope you feel better soon!    ED Prescriptions     Medication Sig Dispense Auth. Provider   medroxyPROGESTERone (PROVERA) 10 MG tablet Take 1 tablet (10 mg total) by mouth daily for 10 days. 10 tablet Yaretzy Olazabal M, FNP   ferrous  sulfate 325 (65 FE) MG tablet Take 1 tablet (325 mg total) by mouth daily. 30 tablet Starlene Eaton, FNP      PDMP not reviewed this encounter.   Starlene Eaton, FNP 12/02/23 1057    Shella Devoid [place], [place] 12/02/23 1057

## 2023-12-02 NOTE — ED Triage Notes (Signed)
 Patient reports that she has had approx 3 long periods within the month. Patient states on 10/28/23 she had a 2 week period and states the last one started on the 5/22 and continues. Patient state she has been having clots, but denies abdominal pain.

## 2023-12-02 NOTE — Discharge Instructions (Addendum)
 Take provera 10mg  once daily to slow down vaginal bleeding.  Take 1 pill of ferrous sulfate (iron) daily.  Blood work has been drawn today, staff will call if blood work is abnormal. Please schedule a follow-up appointment with an OB/GYN listed below for further evaluation and imaging to rule out uterine abnormalities.  Center for [facility]            [address], [place], [place] [address] [phone]   Center for [facility]                                                            [address], [place], [place], [address] [phone]  If your dizziness worsens, or if the provera does not help with vaginal bleeding, please return to urgent care. If you become short of breath, pass out, or develop any new/worsening severe symptoms, please go to the ER.  Follow-up with OB/GYN. I hope you feel better soon!

## 2023-12-05 ENCOUNTER — Ambulatory Visit (HOSPITAL_COMMUNITY): Payer: Self-pay

## 2023-12-13 ENCOUNTER — Encounter (HOSPITAL_COMMUNITY): Payer: Self-pay

## 2023-12-13 ENCOUNTER — Other Ambulatory Visit: Payer: Self-pay

## 2023-12-13 ENCOUNTER — Emergency Department (HOSPITAL_COMMUNITY)
Admission: EM | Admit: 2023-12-13 | Discharge: 2023-12-13 | Disposition: A | Discharge status: Home / Self Care | Attending: Emergency Medicine | Admitting: Emergency Medicine

## 2023-12-13 ENCOUNTER — Emergency Department (HOSPITAL_COMMUNITY)

## 2023-12-13 DIAGNOSIS — R0602 Shortness of breath: Secondary | ICD-10-CM | POA: Insufficient documentation

## 2023-12-13 DIAGNOSIS — R252 Cramp and spasm: Secondary | ICD-10-CM | POA: Diagnosis not present

## 2023-12-13 DIAGNOSIS — N939 Abnormal uterine and vaginal bleeding, unspecified: Secondary | ICD-10-CM | POA: Insufficient documentation

## 2023-12-13 LAB — BASIC METABOLIC PANEL WITH GFR
Anion gap: 9 (ref 5–15)
BUN: 6 mg/dL (ref 6–20)
CO2: 24 mmol/L (ref 22–32)
Calcium: 9.1 mg/dL (ref 8.9–10.3)
Chloride: 103 mmol/L (ref 98–111)
Creatinine, Ser: 0.72 mg/dL (ref 0.44–1.00)
GFR, Estimated: >60 mL/min (ref >60)
Glucose, Bld: 87 mg/dL (ref 70–99)
Potassium: 3.9 mmol/L (ref 3.5–5.1)
Sodium: 136 mmol/L (ref 135–145)

## 2023-12-13 LAB — CBC WITH DIFFERENTIAL/PLATELET
Abs Immature Granulocytes: 0.01 10*3/uL (ref 0.00–0.07)
Basophils Absolute: 0.1 10*3/uL (ref 0.0–0.1)
Basophils Relative: 1 %
Eosinophils Absolute: 0.1 10*3/uL (ref 0.0–0.5)
Eosinophils Relative: 2 %
HCT: 31.5 % — ABNORMAL LOW (ref 36.0–46.0)
Hemoglobin: 9.6 g/dL — ABNORMAL LOW (ref 12.0–15.0)
Immature Granulocytes: 0 %
Lymphocytes Relative: 46 %
Lymphs Abs: 2.4 10*3/uL (ref 0.7–4.0)
MCH: 26.2 pg (ref 26.0–34.0)
MCHC: 30.5 g/dL (ref 30.0–36.0)
MCV: 86.1 fL (ref 80.0–100.0)
Monocytes Absolute: 0.4 10*3/uL (ref 0.1–1.0)
Monocytes Relative: 8 %
Neutro Abs: 2.2 10*3/uL (ref 1.7–7.7)
Neutrophils Relative %: 43 %
Platelets: 325 10*3/uL (ref 150–400)
RBC: 3.66 MIL/uL — ABNORMAL LOW (ref 3.87–5.11)
RDW: 15.9 % — ABNORMAL HIGH (ref 11.5–15.5)
WBC: 5.1 10*3/uL (ref 4.0–10.5)
nRBC: 0 % (ref 0.0–0.2)

## 2023-12-13 LAB — HCG, QUANTITATIVE, PREGNANCY: hCG, Beta Chain, Quant, S: <1 m[IU]/mL (ref <5)

## 2023-12-13 MED ORDER — ONDANSETRON 4 MG PO TBDP: 4.0000 mg | ORAL_TABLET | Freq: Three times a day (TID) | ORAL | 0 refills | Status: DC | PRN

## 2023-12-13 NOTE — ED Notes (Signed)
 Requested urine from pt, pt states she just went to the restroom, no one asked for her urine.

## 2023-12-13 NOTE — ED Provider Notes (Signed)
 [place] EMERGENCY DEPARTMENT AT [facility] Provider Note   CSN: [id number] Arrival date & time: 12/13/23  1230     Patient presents with: Vaginal Bleeding   Kelli Sellers is a 23 y.o. female presents today for vaginal bleeding since 5/2.  Patient was seen 2 weeks ago at urgent care and given 10 days of medroxyprogesterone  which did not help.  Patient is also endorsing cramps and shortness of breath with exertion.  Patient denies fever, chills, vomiting, abnormal vaginal discharge, or possibility of STD/pregnancy.    Vaginal Bleeding      Prior to Admission medications   Medication Sig Start Date End Date Taking? Authorizing Provider  ondansetron  (ZOFRAN -ODT) 4 MG disintegrating tablet Take 1 tablet (4 mg total) by mouth every 8 (eight) hours as needed for nausea or vomiting. 12/13/23  Yes Staphanie Harbison N, PA-C  acetaminophen  (TYLENOL ) 500 MG tablet Take 2 tablets (1,000 mg total) by mouth every 6 (six) hours as needed. 02/14/22   Starlene Eaton, FNP  ferrous sulfate  325 (65 FE) MG tablet Take 1 tablet (325 mg total) by mouth daily. 12/02/23   Starlene Eaton, FNP  medroxyPROGESTERone  (PROVERA ) 10 MG tablet Take 1 tablet (10 mg total) by mouth daily for 10 days. 12/02/23 12/12/23  Starlene Eaton, FNP    Allergies: Patient has no known allergies.    Review of Systems  Genitourinary:  Positive for menstrual problem and vaginal bleeding.    Updated Vital Signs BP 132/87   Pulse 60   Temp 98.1 F (36.7 C) (Oral)   Resp 14   LMP 11/17/2023 (Exact Date)   SpO2 100%   Physical Exam Vitals and nursing note reviewed.  Constitutional:      General: She is not in acute distress.    Appearance: Normal appearance. She is well-developed. She is not ill-appearing, toxic-appearing or diaphoretic.  HENT:     Head: Normocephalic and atraumatic.     Right Ear: External ear normal.     Left Ear: External ear normal.     Nose: Nose normal.     Mouth/Throat:      Mouth: Mucous membranes are moist.     Pharynx: Oropharynx is clear.   Eyes:     Extraocular Movements: Extraocular movements intact.     Conjunctiva/sclera: Conjunctivae normal.    Cardiovascular:     Rate and Rhythm: Normal rate and regular rhythm.     Heart sounds: Normal heart sounds. No murmur heard. Pulmonary:     Effort: Pulmonary effort is normal. No respiratory distress.     Breath sounds: Normal breath sounds.  Abdominal:     Palpations: Abdomen is soft.     Tenderness: There is no abdominal tenderness.   Musculoskeletal:        General: No swelling.     Cervical back: Neck supple.   Skin:    General: Skin is warm and dry.     Capillary Refill: Capillary refill takes less than 2 seconds.   Neurological:     General: No focal deficit present.     Mental Status: She is alert and oriented to person, place, and time.   Psychiatric:        Mood and Affect: Mood normal.     (all labs ordered are listed, but only abnormal results are displayed) Labs Reviewed  CBC WITH DIFFERENTIAL/PLATELET - Abnormal; Notable for the following components:      Result Value   RBC 3.66 (*)  Hemoglobin 9.6 (*)    HCT 31.5 (*)    RDW 15.9 (*)    All other components within normal limits  BASIC METABOLIC PANEL WITH GFR  HCG, QUANTITATIVE, PREGNANCY  URINALYSIS, ROUTINE W REFLEX MICROSCOPIC    EKG: None  Radiology: US  Pelvis Complete Result Date: 12/13/2023 CLINICAL DATA:  Vaginal bleeding, pain EXAM: TRANSABDOMINAL AND TRANSVAGINAL ULTRASOUND OF PELVIS DOPPLER ULTRASOUND OF OVARIES TECHNIQUE: Both transabdominal and transvaginal ultrasound examinations of the pelvis were performed. Transabdominal technique was performed for global imaging of the pelvis including uterus, ovaries, adnexal regions, and pelvic cul-de-sac. It was necessary to proceed with endovaginal exam following the transabdominal exam to visualize the uterus, endometrium, ovaries and adnexa. Color and duplex  Doppler ultrasound was utilized to evaluate blood flow to the ovaries. COMPARISON:  None Available. FINDINGS: Uterus Measurements: 7.8 x 3.2 x 4.6 cm = volume: 61 mL. No fibroids or other mass visualized. Endometrium Thickness: 5 mm in thickness.  No focal abnormality visualized. Right ovary Measurements: 4.3 x 2.8 x 3.1 cm = volume: 19 mL. Normal appearance/no adnexal mass. Left ovary Measurements: 3.5 x 2.7 x 3.9 cm = volume: 19 mL. Normal appearance/no adnexal mass. Pulsed Doppler evaluation of both ovaries demonstrates normal low-resistance arterial and venous waveforms. Other findings Small amount of free fluid in the pelvis. IMPRESSION: No acute findings or significant abnormality. Electronically Signed   By: Janeece Mechanic M.D.   On: 12/13/2023 15:57   US  Transvaginal Non-OB Result Date: 12/13/2023 CLINICAL DATA:  Vaginal bleeding, pain EXAM: TRANSABDOMINAL AND TRANSVAGINAL ULTRASOUND OF PELVIS DOPPLER ULTRASOUND OF OVARIES TECHNIQUE: Both transabdominal and transvaginal ultrasound examinations of the pelvis were performed. Transabdominal technique was performed for global imaging of the pelvis including uterus, ovaries, adnexal regions, and pelvic cul-de-sac. It was necessary to proceed with endovaginal exam following the transabdominal exam to visualize the uterus, endometrium, ovaries and adnexa. Color and duplex Doppler ultrasound was utilized to evaluate blood flow to the ovaries. COMPARISON:  None Available. FINDINGS: Uterus Measurements: 7.8 x 3.2 x 4.6 cm = volume: 61 mL. No fibroids or other mass visualized. Endometrium Thickness: 5 mm in thickness.  No focal abnormality visualized. Right ovary Measurements: 4.3 x 2.8 x 3.1 cm = volume: 19 mL. Normal appearance/no adnexal mass. Left ovary Measurements: 3.5 x 2.7 x 3.9 cm = volume: 19 mL. Normal appearance/no adnexal mass. Pulsed Doppler evaluation of both ovaries demonstrates normal low-resistance arterial and venous waveforms. Other findings Small  amount of free fluid in the pelvis. IMPRESSION: No acute findings or significant abnormality. Electronically Signed   By: Janeece Mechanic M.D.   On: 12/13/2023 15:57   US  Art/Ven Flow Abd Pelv Doppler Result Date: 12/13/2023 CLINICAL DATA:  Vaginal bleeding, pain EXAM: TRANSABDOMINAL AND TRANSVAGINAL ULTRASOUND OF PELVIS DOPPLER ULTRASOUND OF OVARIES TECHNIQUE: Both transabdominal and transvaginal ultrasound examinations of the pelvis were performed. Transabdominal technique was performed for global imaging of the pelvis including uterus, ovaries, adnexal regions, and pelvic cul-de-sac. It was necessary to proceed with endovaginal exam following the transabdominal exam to visualize the uterus, endometrium, ovaries and adnexa. Color and duplex Doppler ultrasound was utilized to evaluate blood flow to the ovaries. COMPARISON:  None Available. FINDINGS: Uterus Measurements: 7.8 x 3.2 x 4.6 cm = volume: 61 mL. No fibroids or other mass visualized. Endometrium Thickness: 5 mm in thickness.  No focal abnormality visualized. Right ovary Measurements: 4.3 x 2.8 x 3.1 cm = volume: 19 mL. Normal appearance/no adnexal mass. Left ovary Measurements: 3.5 x 2.7 x 3.9  cm = volume: 19 mL. Normal appearance/no adnexal mass. Pulsed Doppler evaluation of both ovaries demonstrates normal low-resistance arterial and venous waveforms. Other findings Small amount of free fluid in the pelvis. IMPRESSION: No acute findings or significant abnormality. Electronically Signed   By: Janeece Mechanic M.D.   On: 12/13/2023 15:57     Procedures   Medications Ordered in the ED - No data to display                                  Medical Decision Making Amount and/or Complexity of Data Reviewed Labs: ordered. Radiology: ordered.   This patient presents to the ED for concern of vaginal bleeding and cramping differential diagnosis includes acute blood loss, anemia, STD, menorrhagia, fibroid  Lab Tests:  I Ordered, and personally  interpreted labs.  The pertinent results include: Anemia improved from 8.5 on 6/6 to 9.6 today, BMP WNL, HCG <1 Patient unable to provide urine sample during ED stay.   Imaging Studies ordered:  I ordered imaging studies including ultrasound torsion rule out I independently visualized and interpreted imaging which showed small amount of free fluid in the pelvis, no acute findings or significant abnormality. I agree with the radiologist interpretation  Problem List / ED Course:  Considered for admission or further workup however patient's vital signs, physical exam, labs, and imaging of been reassuring.  Patient given Zofran  for nausea and vomiting.  Patient to follow-up with OB/GYN for further evaluation workup.  Patient given return precautions.  I feel patient is safe for discharge at this time.     Final diagnoses:  Vaginal bleeding    ED Discharge Orders          Ordered    ondansetron  (ZOFRAN -ODT) 4 MG disintegrating tablet  Every 8 hours PRN        12/13/23 1606               Carie Charity, PA-C 12/13/23 1607    Sueellen Emery, MD 12/13/23 [phone]

## 2023-12-13 NOTE — Discharge Instructions (Addendum)
 Today you were seen for vaginal bleeding.  Please continue taking your iron supplement.  Please pick up your Zofran  take as needed for nausea and vomiting.  Please follow-up with OB/GYN as soon as possible for further evaluation workup.  Thank you for letting us  treat you today. After reviewing your labs and imaging, I feel you are safe to go home. Please follow up with your PCP in the next several days and provide them with your records from this visit. Return to the Emergency Room if pain becomes severe or symptoms worsen.

## 2023-12-13 NOTE — ED Triage Notes (Signed)
 Pt c/o excessive vaginal bleeding since 5/2. Pt was given medication at UC 2 weeks ago to stop bleeding, but states it has gotten worse and is now having cramps.

## 2023-12-27 ENCOUNTER — Emergency Department (HOSPITAL_COMMUNITY)
Admission: EM | Admit: 2023-12-27 | Discharge: 2023-12-28 | Disposition: A | Discharge status: Home / Self Care | Source: Intra-hospital | Attending: Emergency Medicine | Admitting: Emergency Medicine

## 2023-12-27 DIAGNOSIS — R4182 Altered mental status, unspecified: Secondary | ICD-10-CM | POA: Diagnosis not present

## 2023-12-27 DIAGNOSIS — F10129 Alcohol abuse with intoxication, unspecified: Secondary | ICD-10-CM | POA: Diagnosis present

## 2023-12-27 DIAGNOSIS — Y906 Blood alcohol level of 120-199 mg/100 ml: Secondary | ICD-10-CM | POA: Insufficient documentation

## 2023-12-27 DIAGNOSIS — Z79899 Other long term (current) drug therapy: Secondary | ICD-10-CM | POA: Insufficient documentation

## 2023-12-27 DIAGNOSIS — F10920 Alcohol use, unspecified with intoxication, uncomplicated: Secondary | ICD-10-CM

## 2023-12-27 LAB — CBC
HCT: 28.8 % — ABNORMAL LOW (ref 36.0–46.0)
Hemoglobin: 8.9 g/dL — ABNORMAL LOW (ref 12.0–15.0)
MCH: 25.1 pg — ABNORMAL LOW (ref 26.0–34.0)
MCHC: 30.9 g/dL (ref 30.0–36.0)
MCV: 81.1 fL (ref 80.0–100.0)
Platelets: 277 10*3/uL (ref 150–400)
RBC: 3.55 MIL/uL — ABNORMAL LOW (ref 3.87–5.11)
RDW: 14.1 % (ref 11.5–15.5)
WBC: 6.1 10*3/uL (ref 4.0–10.5)
nRBC: 0 % (ref 0.0–0.2)

## 2023-12-27 LAB — COMPREHENSIVE METABOLIC PANEL WITH GFR
ALT: 22 U/L (ref 0–44)
AST: 32 U/L (ref 15–41)
Albumin: 3.8 g/dL (ref 3.5–5.0)
Alkaline Phosphatase: 51 U/L (ref 38–126)
Anion gap: 11 (ref 5–15)
BUN: 10 mg/dL (ref 6–20)
CO2: 22 mmol/L (ref 22–32)
Calcium: 8.8 mg/dL — ABNORMAL LOW (ref 8.9–10.3)
Chloride: 104 mmol/L (ref 98–111)
Creatinine, Ser: 0.76 mg/dL (ref 0.44–1.00)
GFR, Estimated: >60 mL/min (ref >60)
Glucose, Bld: 85 mg/dL (ref 70–99)
Potassium: 3.2 mmol/L — ABNORMAL LOW (ref 3.5–5.1)
Sodium: 137 mmol/L (ref 135–145)
Total Bilirubin: 0.2 mg/dL (ref 0.0–1.2)
Total Protein: 6.9 g/dL (ref 6.5–8.1)

## 2023-12-27 LAB — HCG, SERUM, QUALITATIVE: Preg, Serum: NEGATIVE

## 2023-12-27 LAB — ACETAMINOPHEN LEVEL: Acetaminophen (Tylenol), Serum: <10 ug/mL — ABNORMAL LOW (ref 10–30)

## 2023-12-27 LAB — ETHANOL: Alcohol, Ethyl (B): 123 mg/dL — ABNORMAL HIGH (ref <15)

## 2023-12-27 NOTE — ED Provider Notes (Incomplete)
  [place] EMERGENCY DEPARTMENT AT [facility] Provider Note   CSN: [id number] Arrival date & time: 12/27/23  2214     Patient presents with: Altered Mental Status   Kelli Sellers is a 23 y.o. female with medical history of hand surgery.  Patient presents to ED for evaluation of intoxication.  Per triage note, the patient has been drinking liquor for some time now.  Apparently thought she lost consciousness but denies this on my examination.  She states to me that she has been drinking liquor about the course of the day but cannot tell me how much liquor or what kind.  She denies any drug use.  Denies any SI, HI, AVH.  Alert and oriented x 4.  Denies medical complaints.  Unsure LMP.    Altered Mental Status      Prior to Admission medications   Medication Sig Start Date End Date Taking? Authorizing Provider  acetaminophen  (TYLENOL ) 500 MG tablet Take 2 tablets (1,000 mg total) by mouth every 6 (six) hours as needed. 02/14/22   Enedelia Dorna CHRISTELLA, FNP  ferrous sulfate  325 (65 FE) MG tablet Take 1 tablet (325 mg total) by mouth daily. 12/02/23   Enedelia Dorna CHRISTELLA, FNP  medroxyPROGESTERone  (PROVERA ) 10 MG tablet Take 1 tablet (10 mg total) by mouth daily for 10 days. 12/02/23 12/12/23  Enedelia Dorna CHRISTELLA, FNP  ondansetron  (ZOFRAN -ODT) 4 MG disintegrating tablet Take 1 tablet (4 mg total) by mouth every 8 (eight) hours as needed for nausea or vomiting. 12/13/23   Keith, Kayla N, PA-C    Allergies: Patient has no known allergies.    Review of Systems  Updated Vital Signs BP 110/74 (BP Location: Right Arm)   Pulse 92   Resp 15   LMP 11/17/2023 (Exact Date)   SpO2 98%   Physical Exam  (all labs ordered are listed, but only abnormal results are displayed) Labs Reviewed  HCG, SERUM, QUALITATIVE  CBC  COMPREHENSIVE METABOLIC PANEL WITH GFR  ETHANOL  RAPID URINE DRUG SCREEN, HOSP PERFORMED  URINALYSIS, ROUTINE W REFLEX MICROSCOPIC  ACETAMINOPHEN  LEVEL     EKG: None  Radiology: No results found.  {Document cardiac monitor, telemetry assessment procedure when appropriate:32947} Procedures   Medications Ordered in the ED - No data to display    {Click here for ABCD2, HEART and other calculators REFRESH Note before signing:1}                              Medical Decision Making Amount and/or Complexity of Data Reviewed Labs: ordered.   ***  {Document critical care time when appropriate  Document review of labs and clinical decision tools ie CHADS2VASC2, etc  Document your independent review of radiology images and any outside records  Document your discussion with family members, caretakers and with consultants  Document social determinants of health affecting pt's care  Document your decision making why or why not admission, treatments were needed:32947:::1}   Final diagnoses:  None    ED Discharge Orders     None

## 2023-12-27 NOTE — ED Provider Notes (Signed)
 [name] EMERGENCY DEPARTMENT AT [facility] Provider Note   CSN: [id number] Arrival date & time: 12/27/23  2214     Patient presents with: Altered Mental Status   Kelli Sellers is a 23 y.o. female with medical history of hand surgery.  Patient presents to ED for evaluation of intoxication.  Per triage note, the patient has been drinking liquor for some time now.  Apparently thought she lost consciousness but denies this on my examination.  She states to me that she has been drinking liquor about the course of the day but cannot tell me how much liquor or what kind.  She denies any drug use.  Denies any SI, HI, AVH.  Alert and oriented x 4.  Denies medical complaints.  Unsure LMP.    Altered Mental Status      Prior to Admission medications   Medication Sig Start Date End Date Taking? Authorizing Provider  acetaminophen  (TYLENOL ) 500 MG tablet Take 2 tablets (1,000 mg total) by mouth every 6 (six) hours as needed. 02/14/22   Kelli Dorna CHRISTELLA, FNP  ferrous sulfate  325 (65 FE) MG tablet Take 1 tablet (325 mg total) by mouth daily. 12/02/23   Kelli Dorna CHRISTELLA, FNP  medroxyPROGESTERone  (PROVERA ) 10 MG tablet Take 1 tablet (10 mg total) by mouth daily for 10 days. 12/02/23 12/12/23  Kelli Dorna CHRISTELLA, FNP  ondansetron  (ZOFRAN -ODT) 4 MG disintegrating tablet Take 1 tablet (4 mg total) by mouth every 8 (eight) hours as needed for nausea or vomiting. 12/13/23   Keith, Kayla N, PA-C    Allergies: Patient has no known allergies.    Review of Systems  Unable to perform ROS: Acuity of condition (Level 5 caveat)  All other systems reviewed and are negative.   Updated Vital Signs BP 114/74   Pulse 62   Temp (!) 97 F (36.1 C) (Axillary)   Resp 18   LMP 11/17/2023 (Exact Date)   SpO2 99%   Physical Exam Vitals and nursing note reviewed.  Constitutional:      General: She is not in acute distress.    Appearance: She is well-developed.  HENT:     Head:  Normocephalic and atraumatic.  Eyes:     Conjunctiva/sclera: Conjunctivae normal.  Cardiovascular:     Rate and Rhythm: Normal rate and regular rhythm.     Heart sounds: No murmur heard. Pulmonary:     Effort: Pulmonary effort is normal. No respiratory distress.     Breath sounds: Normal breath sounds.  Abdominal:     Palpations: Abdomen is soft.     Tenderness: There is no abdominal tenderness.     Comments: No abdominal TTP  Musculoskeletal:        General: No swelling.     Cervical back: Neck supple.  Skin:    General: Skin is warm and dry.     Capillary Refill: Capillary refill takes less than 2 seconds.  Neurological:     Mental Status: She is alert and oriented to person, place, and time. Mental status is at baseline.     Comments: CN III through XII intact.  Intact finger-nose, heel-to-shin.  No pronator drift.  No slurred speech.  Equal strength throughout.  Equal sensation throughout.  PERRL.  Tracks cross midline.  Alert and oriented x 4.  Psychiatric:        Mood and Affect: Mood normal.     (all labs ordered are listed, but only abnormal results are displayed) Labs Reviewed  CBC - Abnormal; Notable for the following components:      Result Value   RBC 3.55 (*)    Hemoglobin 8.9 (*)    HCT 28.8 (*)    MCH 25.1 (*)    All other components within normal limits  COMPREHENSIVE METABOLIC PANEL WITH GFR - Abnormal; Notable for the following components:   Potassium 3.2 (*)    Calcium 8.8 (*)    All other components within normal limits  ETHANOL - Abnormal; Notable for the following components:   Alcohol, Ethyl (B) 123 (*)    All other components within normal limits  ACETAMINOPHEN  LEVEL - Abnormal; Notable for the following components:   Acetaminophen  (Tylenol ), Serum <10 (*)    All other components within normal limits  HCG, SERUM, QUALITATIVE  RAPID URINE DRUG SCREEN, HOSP PERFORMED  URINALYSIS, ROUTINE W REFLEX MICROSCOPIC    EKG: EKG  Interpretation Date/Time:  Tuesday December 27 2023 23:58:26 EDT Ventricular Rate:  61 PR Interval:  158 QRS Duration:  70 QT Interval:  444 QTC Calculation: 446 R Axis:   77  Text Interpretation: Normal sinus rhythm Normal ECG No previous ECGs available Confirmed by Kelli Sellers ([id number]) on 12/28/2023 12:03:13 AM  Radiology: No results found.   Procedures   Medications Ordered in the ED  potassium chloride SA (KLOR-CON M) CR tablet 40 mEq (has no administration in time range)    Clinical Course as of 12/28/23 0220  Wed Dec 28, 2023  0000 Spoke with patient roommate who presented after initial interview.  Patient roommate reports patient came home intoxicated after drinking all day.  Reports that she was throwing up in the bathroom and apparently might have lost consciousness for maybe 30 seconds.  EMS was called due to patient throwing up and losing consciousness. Roommate denies patient fell and struck her head. [CG]  0140 Attempted to ambulate patient.  Nursing staff notified me that patient was unable to ambulate at this time.  Will continue to observe. [CG]    Clinical Course User Index [CG] Kelli Lonni FALCON, PA-C   Medical Decision Making Amount and/or Complexity of Data Reviewed Labs: ordered.   23 year old female presents for evaluation.  Please see HPI for further details.  On examination the patient is afebrile and nontachycardic.  Her lung sounds are clear bilaterally, she is not hypoxic.  Abdomen is soft and compressible.  Neurological examination at baseline without focal neurodeficits.  Alert and oriented x 4.  Patient appears clinically intoxicated.  Roommate who presented after initial examination and interview states that patient was noted to be drinking earlier today.  Concern was raised when the patient seemingly passed out after having multiple episodes of emesis at home.  EMS was called who transported patient to ED after giving Zofran .  In the ED, the  patient has remained hemodynamically stable.  Labs are collected which show an ethanol of 123.  Her metabolic panel reveals a potassium 3.2 repleted with 40 mEq oral potassium.  Her CBC reveals a baseline hemoglobin 8.9, no leukocytosis.  hCG serum negative.  Acetaminophen  level negative.  Patient has been observed for 4 hours.  Patient was ambulated by nursing staff and was able to ambulate with steady gait.  She is requesting discharge.  She has her roommate here who will provide her transport home.  I feel this is reasonable.  Patient was advised to discontinue use of alcohol in the future.  Advised to follow-up with PCP.  Given return precautions and she voiced  understanding.  She is stable to discharge.    Final diagnoses:  Alcoholic intoxication without complication [facility])    ED Discharge Orders     None          Kelli Lonni JULIANNA DEVONNA 12/28/23 0221    Kelli Lenis, MD 12/28/23 [phone]

## 2023-12-27 NOTE — ED Triage Notes (Addendum)
 Pt been thinking liquor for few hours now. They thought she was loss consciousness. Alert with EMS- confused at first but now fully alert. 4 mg zofran  given by EMS. VSS. Sats normal, breathing regularly. CBG 141.

## 2023-12-28 LAB — URINALYSIS, ROUTINE W REFLEX MICROSCOPIC
Bilirubin Urine: NEGATIVE
Glucose, UA: NEGATIVE mg/dL
Hgb urine dipstick: NEGATIVE
Ketones, ur: NEGATIVE mg/dL
Leukocytes,Ua: NEGATIVE
Nitrite: NEGATIVE
Protein, ur: NEGATIVE mg/dL
Specific Gravity, Urine: 1.008 (ref 1.005–1.030)
pH: 7 (ref 5.0–8.0)

## 2023-12-28 LAB — RAPID URINE DRUG SCREEN, HOSP PERFORMED
Amphetamines: NOT DETECTED
Barbiturates: NOT DETECTED
Benzodiazepines: NOT DETECTED
Cocaine: NOT DETECTED
Opiates: NOT DETECTED
Tetrahydrocannabinol: POSITIVE — AB

## 2023-12-28 MED ORDER — POTASSIUM CHLORIDE CRYS ER 20 MEQ PO TBCR
40.0000 meq | EXTENDED_RELEASE_TABLET | Freq: Once | ORAL | Status: AC
  Administered 2023-12-28: 40 meq via ORAL
  Filled 2023-12-28: qty 2

## 2023-12-28 NOTE — ED Notes (Signed)
 Pt ambulated to restroom without distress.

## 2023-12-28 NOTE — ED Notes (Signed)
 Pt refused to get up to give urine sample

## 2023-12-28 NOTE — Discharge Instructions (Addendum)
 It was a pleasure taking part in your care.  Please discontinue use of alcohol.  Please follow-up with your PCP.  Return to the ED with any new or worsening symptoms.

## 2023-12-29 ENCOUNTER — Encounter: Payer: Self-pay | Admitting: Obstetrics and Gynecology

## 2023-12-29 ENCOUNTER — Other Ambulatory Visit (HOSPITAL_COMMUNITY)
Admission: RE | Admit: 2023-12-29 | Discharge: 2023-12-29 | Disposition: A | Discharge status: Home / Self Care | Source: Ambulatory Visit | Attending: Obstetrics and Gynecology | Admitting: Obstetrics and Gynecology

## 2023-12-29 ENCOUNTER — Ambulatory Visit (INDEPENDENT_AMBULATORY_CARE_PROVIDER_SITE_OTHER): Admitting: Obstetrics and Gynecology

## 2023-12-29 VITALS — BP 128/87 | HR 58 | Ht 60.0 in | Wt 101.0 lb

## 2023-12-29 DIAGNOSIS — Z01419 Encounter for gynecological examination (general) (routine) without abnormal findings: Secondary | ICD-10-CM | POA: Diagnosis present

## 2023-12-29 DIAGNOSIS — N926 Irregular menstruation, unspecified: Secondary | ICD-10-CM

## 2023-12-29 DIAGNOSIS — N898 Other specified noninflammatory disorders of vagina: Secondary | ICD-10-CM | POA: Diagnosis not present

## 2023-12-29 NOTE — Progress Notes (Signed)
 NGYN presents for irregular periods. Pt states periods have always been irregular. First menses age 23.  No PAP Hx, requesting PAP. Declines STD testing and [facility]   Decline BH referral

## 2023-12-29 NOTE — Progress Notes (Signed)
   WELL-WOMAN PHYSICAL & PAP Patient name: Kelli Sellers MRN [id number]  Date of birth: May 07, 2001 Chief Complaint:   No chief complaint on file.  History of Present Illness:   Kelli Sellers is a 23 y.o. G0P0000 African American female being seen today for a routine well-woman exam.  Current complaints: irregular menses with heavy bleeding.  PCP: Pcp, No       does not desire labs Patient's last menstrual period was 11/17/2023 (exact date). The current method of family planning is none.  Last pap n/a.  Last mammogram: n/a. Family h/o breast cancer: No Last colonoscopy: n/a. Family h/o colorectal cancer: No Review of Systems:   Pertinent items are noted in HPI Denies any headaches, blurred vision, fatigue, shortness of breath, chest pain, abdominal pain, abnormal vaginal discharge/itching/odor/irritation, problems with periods, bowel movements, urination, or intercourse unless otherwise stated above. Pertinent History Reviewed:  Reviewed past medical,surgical, social and family history.  Reviewed problem list, medications and allergies. Physical Assessment:   Vitals:   12/29/23 1435  BP: 128/87  Pulse: (!) 58  Weight: 101 lb (45.8 kg)  Height: 5' (1.524 m)  Body mass index is 19.73 kg/m.        Physical Examination:   General appearance - well appearing, and in no distress  Mental status - alert, oriented to person, place, and time  Psych:  She has a normal mood and affect  Skin - warm and dry, normal color, no suspicious lesions noted  Chest - effort normal, all lung fields clear to auscultation bilaterally  Heart - normal rate and regular rhythm  Neck:  midline trachea, no thyromegaly or nodules  Breasts - breasts appear normal, no suspicious masses, no skin or nipple changes or  axillary nodes  Abdomen - soft, nontender, nondistended, no masses or organomegaly  Pelvic - VULVA: normal appearing vulva with no masses, tenderness or lesions  VAGINA: normal appearing vagina  with normal color and discharge, no lesions  CERVIX: normal appearing cervix without discharge or lesions, no CMT  Thin prep pap is done with reflex HR HPV cotesting  UTERUS: uterus is felt to be normal size, shape, consistency and nontender   ADNEXA: No adnexal masses or tenderness noted.  Rectal - deferred  Extremities:  No swelling or varicosities noted  No results found for this or any previous visit (from the past 24 hours).  Assessment & Plan:  1) Encounter for well woman exam with routine gynecological exam (Primary) - Cytology - PAP( [place])  2) Irregular menses - Offered BC method -- patient declined  3) Vaginal discharge - Cervicovaginal ancillary only( [place])   Labs/procedures today: pap and wet prep  Mammogram at age 58 or sooner if problems Colonoscopy at age 34 or sooner if problems  No orders of the defined types were placed in this encounter.   Meds: No orders of the defined types were placed in this encounter.   Follow-up: Return in about 1 year (around 12/28/2024) for Annual Exam.  Ala Cart MSN, CNM 12/29/2023 3:14 PM

## 2023-12-31 ENCOUNTER — Encounter: Payer: Self-pay | Admitting: Obstetrics and Gynecology

## 2024-01-03 LAB — CERVICOVAGINAL ANCILLARY ONLY
Bacterial Vaginitis (gardnerella): POSITIVE — AB
Candida Glabrata: NEGATIVE
Candida Vaginitis: POSITIVE — AB
Chlamydia: POSITIVE — AB
Comment: NEGATIVE
Comment: NEGATIVE
Comment: NEGATIVE
Comment: NEGATIVE
Comment: NEGATIVE
Comment: NORMAL
Neisseria Gonorrhea: POSITIVE — AB
Trichomonas: NEGATIVE

## 2024-01-04 ENCOUNTER — Ambulatory Visit: Payer: Self-pay | Admitting: Obstetrics and Gynecology

## 2024-01-04 ENCOUNTER — Other Ambulatory Visit: Payer: Self-pay

## 2024-01-04 ENCOUNTER — Encounter: Payer: Self-pay | Admitting: Obstetrics and Gynecology

## 2024-01-04 MED ORDER — METRONIDAZOLE 500 MG PO TABS: 500.0000 mg | ORAL_TABLET | Freq: Two times a day (BID) | ORAL | 0 refills | Status: DC

## 2024-01-04 MED ORDER — DOXYCYCLINE HYCLATE 100 MG PO CAPS: 100.0000 mg | ORAL_CAPSULE | Freq: Two times a day (BID) | ORAL | 0 refills | Status: AC

## 2024-01-04 MED ORDER — FLUCONAZOLE 150 MG PO TABS: 150.0000 mg | ORAL_TABLET | Freq: Once | ORAL | 0 refills | Status: AC

## 2024-01-05 ENCOUNTER — Ambulatory Visit

## 2024-01-05 VITALS — BP 129/80 | HR 73

## 2024-01-05 DIAGNOSIS — A549 Gonococcal infection, unspecified: Secondary | ICD-10-CM | POA: Diagnosis not present

## 2024-01-05 MED ORDER — CEFTRIAXONE SODIUM 500 MG IJ SOLR
500.0000 mg | Freq: Once | INTRAMUSCULAR | Status: AC
  Administered 2024-01-05: 500 mg via INTRAMUSCULAR

## 2024-01-05 NOTE — Progress Notes (Signed)
 Pt presents for rocephin  injection. Injection given in RUOQ. Pt tolerated well. Pt aware to follow up in 6-8 weeks for a repeat swab.

## 2024-01-06 LAB — CYTOLOGY - PAP: Diagnosis: NEGATIVE

## 2024-02-14 ENCOUNTER — Other Ambulatory Visit: Payer: Self-pay

## 2024-02-14 ENCOUNTER — Emergency Department (HOSPITAL_COMMUNITY)
Admission: EM | Admit: 2024-02-14 | Discharge: 2024-02-14 | Disposition: A | Discharge status: Home / Self Care | Attending: Emergency Medicine | Admitting: Emergency Medicine

## 2024-02-14 DIAGNOSIS — N939 Abnormal uterine and vaginal bleeding, unspecified: Secondary | ICD-10-CM | POA: Diagnosis present

## 2024-02-14 DIAGNOSIS — Z7722 Contact with and (suspected) exposure to environmental tobacco smoke (acute) (chronic): Secondary | ICD-10-CM | POA: Diagnosis not present

## 2024-02-14 LAB — CBC
HCT: 33.7 % — ABNORMAL LOW (ref 36.0–46.0)
Hemoglobin: 10.2 g/dL — ABNORMAL LOW (ref 12.0–15.0)
MCH: 23.3 pg — ABNORMAL LOW (ref 26.0–34.0)
MCHC: 30.3 g/dL (ref 30.0–36.0)
MCV: 77.1 fL — ABNORMAL LOW (ref 80.0–100.0)
Platelets: 298 K/uL (ref 150–400)
RBC: 4.37 MIL/uL (ref 3.87–5.11)
RDW: 15.9 % — ABNORMAL HIGH (ref 11.5–15.5)
WBC: 5.6 K/uL (ref 4.0–10.5)
nRBC: 0 % (ref 0.0–0.2)

## 2024-02-14 LAB — HCG, SERUM, QUALITATIVE: Preg, Serum: NEGATIVE

## 2024-02-14 NOTE — ED Provider Notes (Signed)
 [facility] Emergency Department Provider Note MRN:  [id number]  Arrival date & time: 02/14/24     Chief Complaint   Vaginal Bleeding   History of Present Illness   Kelli Sellers is a 23 y.o. year-old female with no pertinent past medical history presenting to the ED with chief complaint of vaginal bleeding.  Patient has a long history of abnormal uterine bleeding, went 2 months without a period and then started her cycle on 6 August.  Stopped bleeding for a few days and then started spotting again yesterday.  Passed a large clot today that was concerning to her, thought maybe it was fetal tissue or miscarriage, here for evaluation.  Having some mild lower abdominal cramping similar to prior menstrual cramps.  No fever, no other complaints.  Review of Systems  A thorough review of systems was obtained and all systems are negative except as noted in the HPI and PMH.   Patient's Health History   No past medical history on file.  Past Surgical History:  Procedure Laterality Date   HAND SURGERY     spider bite infection      Family History  Problem Relation Age of Onset   Healthy Mother    Healthy Father     Social History   Socioeconomic History   Marital status: Single    Spouse name: Not on file   Number of children: Not on file   Years of education: Not on file   Highest education level: Not on file  Occupational History   Not on file  Tobacco Use   Smoking status: Never    Passive exposure: Yes   Smokeless tobacco: Never  Vaping Use   Vaping status: Never Used  Substance and Sexual Activity   Alcohol use: Yes    Comment: socially   Drug use: Yes    Types: Marijuana   Sexual activity: Yes    Birth control/protection: None  Other Topics Concern   Not on file  Social History Narrative   ** Merged History Encounter **       Social Drivers of Corporate investment banker Strain: Not on file  Food Insecurity: Not on file   Transportation Needs: Not on file  Physical Activity: Not on file  Stress: Not on file  Social Connections: Not on file  Intimate Partner Violence: Not on file     Physical Exam   Vitals:   02/14/24 2213  BP: (!) 122/92  Pulse: 66  Resp: 16  Temp: (!) 97.5 F (36.4 C)  SpO2: 98%    CONSTITUTIONAL: Well-appearing, NAD NEURO/PSYCH:  Alert and oriented x 3, no focal deficits EYES:  eyes equal and reactive ENT/NECK:  no LAD, no JVD CARDIO: Regular rate, well-perfused, normal S1 and S2 PULM:  CTAB no wheezing or rhonchi GI/GU:  non-distended, non-tender MSK/SPINE:  No gross deformities, no edema SKIN:  no rash, atraumatic   *Additional and/or pertinent findings included in MDM below  Diagnostic and Interventional Summary    EKG Interpretation Date/Time:    Ventricular Rate:    PR Interval:    QRS Duration:    QT Interval:    QTC Calculation:   R Axis:      Text Interpretation:         Labs Reviewed  CBC - Abnormal; Notable for the following components:      Result Value   Hemoglobin 10.2 (*)    HCT 33.7 (*)    MCV  77.1 (*)    MCH 23.3 (*)    RDW 15.9 (*)    All other components within normal limits  HCG, SERUM, QUALITATIVE    No orders to display    Medications - No data to display   Procedures  /  Critical Care Procedures  ED Course and Medical Decision Making  Initial Impression and Ddx Differential diagnosis includes abnormal uterine bleeding versus miscarriage versus ectopic pregnancy.  Particular attention to hCG.  Past medical/surgical history that increases complexity of ED encounter: None  Interpretation of Diagnostics I personally reviewed the Laboratory Testing and my interpretation is as follows: No significant blood count or electrolyte disturbance.  hCG negative    Patient Reassessment and Ultimate Disposition/Management     With negative pregnancy test and reassuring exam, benign abdomen patient is appropriate for  discharge.  Patient management required discussion with the following services or consulting groups:  None  Complexity of Problems Addressed Acute illness or injury that poses threat of life of bodily function  Additional Data Reviewed and Analyzed Further history obtained from: Prior labs/imaging results  Additional Factors Impacting ED Encounter Risk None  Ozell HERO. Theadore, MD [facility] Emergency Medicine [facility] [email or web address]  Final Clinical Impressions(s) / ED Diagnoses     ICD-10-CM   1. Vaginal bleeding  N93.9       ED Discharge Orders     None        Discharge Instructions Discussed with and Provided to Patient:    Discharge Instructions      You were evaluated in the Emergency Department and after careful evaluation, we did not find any emergent condition requiring admission or further testing in the hospital.  Your exam/testing today is overall reassuring.  Symptoms likely due to abnormal uterine bleeding.  Can follow-up with her primary care doctor or with your OB/GYN.  If you still need a primary care doctor you can use [email or web address].  Please return to the Emergency Department if you experience any worsening of your condition.   Thank you for allowing us  to be a part of your care.      Theadore Ozell HERO, MD 02/14/24 [phone]

## 2024-02-14 NOTE — Discharge Instructions (Addendum)
 You were evaluated in the Emergency Department and after careful evaluation, we did not find any emergent condition requiring admission or further testing in the hospital.  Your exam/testing today is overall reassuring.  Symptoms likely due to abnormal uterine bleeding.  Can follow-up with her primary care doctor or with your OB/GYN.  If you still need a primary care doctor you can use [email or web address].  Please return to the Emergency Department if you experience any worsening of your condition.   Thank you for allowing us  to be a part of your care.

## 2024-02-14 NOTE — ED Triage Notes (Signed)
 Patient c/o vaginal bleeding. Patient report 1 soaked pad after bleeding occur. Patient report lower abdominal pain. Patient denies N/V.

## 2024-02-20 ENCOUNTER — Encounter: Payer: Self-pay | Admitting: Obstetrics and Gynecology

## 2024-02-20 ENCOUNTER — Ambulatory Visit (HOSPITAL_COMMUNITY)
Admission: EM | Admit: 2024-02-20 | Discharge: 2024-02-20 | Disposition: A | Discharge status: Home / Self Care | Attending: Family Medicine | Admitting: Family Medicine

## 2024-02-20 ENCOUNTER — Encounter (HOSPITAL_COMMUNITY): Payer: Self-pay | Admitting: Emergency Medicine

## 2024-02-20 DIAGNOSIS — R42 Dizziness and giddiness: Secondary | ICD-10-CM | POA: Insufficient documentation

## 2024-02-20 DIAGNOSIS — N939 Abnormal uterine and vaginal bleeding, unspecified: Secondary | ICD-10-CM | POA: Diagnosis present

## 2024-02-20 LAB — CBC
HCT: 30.8 % — ABNORMAL LOW (ref 36.0–46.0)
Hemoglobin: 9.5 g/dL — ABNORMAL LOW (ref 12.0–15.0)
MCH: 23.7 pg — ABNORMAL LOW (ref 26.0–34.0)
MCHC: 30.8 g/dL (ref 30.0–36.0)
MCV: 76.8 fL — ABNORMAL LOW (ref 80.0–100.0)
Platelets: 306 K/uL (ref 150–400)
RBC: 4.01 MIL/uL (ref 3.87–5.11)
RDW: 16.1 % — ABNORMAL HIGH (ref 11.5–15.5)
WBC: 3.7 K/uL — ABNORMAL LOW (ref 4.0–10.5)
nRBC: 0 % (ref 0.0–0.2)

## 2024-02-20 LAB — POCT URINE PREGNANCY: Preg Test, Ur: NEGATIVE

## 2024-02-20 MED ORDER — FERROUS SULFATE 325 (65 FE) MG PO TABS: 325.0000 mg | ORAL_TABLET | Freq: Every day | ORAL | 1 refills | Status: DC

## 2024-02-20 MED ORDER — MEDROXYPROGESTERONE ACETATE 10 MG PO TABS: 10.0000 mg | ORAL_TABLET | Freq: Every day | ORAL | 0 refills | Status: DC

## 2024-02-20 NOTE — ED Provider Notes (Signed)
 [facility]    CSN: [id number] Arrival date & time: 02/20/24  1303      History   Chief Complaint Chief Complaint  Patient presents with   Vaginal Bleeding    HPI Kelli Sellers is a 23 y.o. female.    Vaginal Bleeding Here for abnormal uterine bleeding. She had gone to the ER 8/19 for heavy bleeding. Serum preg neg then and H/H 10/33.7.  After that visit the bleeding intensified. She now has had some dizziness and fatigue. Having to change pads frequently. No therapy rx'd at 8/19 visit to her knowledge  NKDA  This menstrual cycle began 8/6.  She was positive for Chlamydia and gonorrhea in early July; states she took all rx'd treatment.  No f/c/dysuria. Some pelvic cramping  History reviewed. No pertinent past medical history.  There are no active problems to display for this patient.   Past Surgical History:  Procedure Laterality Date   HAND SURGERY     spider bite infection      OB History     Gravida  0   Para  0   Term  0   Preterm  0   AB  0   Living  0      SAB  0   IAB  0   Ectopic  0   Multiple  0   Live Births  0            Home Medications    Prior to Admission medications   Medication Sig Start Date End Date Taking? Authorizing Provider  medroxyPROGESTERone  (PROVERA ) 10 MG tablet Take 1 tablet (10 mg total) by mouth daily for 10 days. 02/20/24 03/01/24 Yes Dera Vanaken, Sharlet POUR, MD  acetaminophen  (TYLENOL ) 500 MG tablet Take 2 tablets (1,000 mg total) by mouth every 6 (six) hours as needed. 02/14/22   Enedelia Dorna CHRISTELLA, FNP  ferrous sulfate  325 (65 FE) MG tablet Take 1 tablet (325 mg total) by mouth daily. 02/20/24   Vonna Sharlet POUR, MD  ondansetron  (ZOFRAN -ODT) 4 MG disintegrating tablet Take 1 tablet (4 mg total) by mouth every 8 (eight) hours as needed for nausea or vomiting. Patient not taking: Reported on 01/05/2024 12/13/23   Francis Ileana SAILOR, PA-C    Family History Family History  Problem Relation Age of  Onset   Healthy Mother    Healthy Father     Social History Social History   Tobacco Use   Smoking status: Never    Passive exposure: Yes   Smokeless tobacco: Never  Vaping Use   Vaping status: Never Used  Substance Use Topics   Alcohol use: Yes    Comment: socially   Drug use: Yes    Types: Marijuana     Allergies   Patient has no known allergies.   Review of Systems Review of Systems  Genitourinary:  Positive for vaginal bleeding.     Physical Exam Triage Vital Signs ED Triage Vitals  Encounter Vitals Group     BP 02/20/24 1418 132/86     Girls Systolic BP Percentile --      Girls Diastolic BP Percentile --      Boys Systolic BP Percentile --      Boys Diastolic BP Percentile --      Pulse Rate 02/20/24 1418 60     Resp 02/20/24 1418 14     Temp 02/20/24 1418 98.3 F (36.8 C)     Temp Source 02/20/24 1418 Oral  SpO2 02/20/24 1418 98 %     Weight --      Height --      Head Circumference --      Peak Flow --      Pain Score 02/20/24 1417 7     Pain Loc --      Pain Education --      Exclude from Growth Chart --    Orthostatic VS for the past 24 hrs:  BP- Lying Pulse- Lying BP- Sitting Pulse- Sitting BP- Standing at 0 minutes Pulse- Standing at 0 minutes  02/20/24 1456 113/73 62 123/84 64 129/84 66    Updated Vital Signs BP 132/86 (BP Location: Left Arm)   Pulse 60   Temp 98.3 F (36.8 C) (Oral)   Resp 14   LMP 02/01/2024   SpO2 98%   Visual Acuity Right Eye Distance:   Left Eye Distance:   Bilateral Distance:    Right Eye Near:   Left Eye Near:    Bilateral Near:     Physical Exam Vitals reviewed.  Constitutional:      General: She is not in acute distress.    Appearance: She is not ill-appearing, toxic-appearing or diaphoretic.  HENT:     Mouth/Throat:     Comments: Mild pallor of MM. MM moist.  Eyes:     Extraocular Movements: Extraocular movements intact.     Conjunctiva/sclera: Conjunctivae normal.     Pupils: Pupils  are equal, round, and reactive to light.  Cardiovascular:     Rate and Rhythm: Normal rate and regular rhythm.     Heart sounds: No murmur heard. Pulmonary:     Effort: Pulmonary effort is normal.     Breath sounds: Normal breath sounds.  Abdominal:     Palpations: Abdomen is soft.     Comments: Tenderness of suprapubic area  Musculoskeletal:     Cervical back: Neck supple.  Lymphadenopathy:     Cervical: No cervical adenopathy.  Skin:    Coloration: Skin is not jaundiced or pale.  Neurological:     General: No focal deficit present.     Mental Status: She is alert and oriented to person, place, and time.  Psychiatric:        Behavior: Behavior normal.      UC Treatments / Results  Labs (all labs ordered are listed, but only abnormal results are displayed) Labs Reviewed  CBC  POCT URINE PREGNANCY  CERVICOVAGINAL ANCILLARY ONLY    EKG   Radiology No results found.  Procedures Procedures (including critical care time)  Medications Ordered in UC Medications - No data to display  Initial Impression / Assessment and Plan / UC Course  I have reviewed the triage vital signs and the nursing notes.  Pertinent labs & imaging results that were available during my care of the patient were reviewed by me and considered in my medical decision making (see chart for details).     Vital signs are not orthostatic when redone.  UPT negative. Vaginal self swab is Sellers, and we will notify of any positives on that and treat per protocol.  Iron is resent to treat the anemia, and provera  is sent to try to stop the bleeding. She will followup with her gyn. Final Clinical Impressions(s) / UC Diagnoses   Final diagnoses:  Abnormal uterine bleeding  Dizziness     Discharge Instructions      Your blood pressure remained good while the vital signs were Sellers.  The  pregnancy test was negative.   Staff will notify you if there is anything positive or abnormal on the swab (or  on the blood work if that has been taken at this visit). It can take 2-3 days for the tests to result, depending on the day of the week your test was taken. You will only be notified if there are any positives on the testing; test results will also go to your MyChart if you are signed up for MyChart.   Take medroxyprogesterone  10 mg--1 tablet daily for 10 days.  Hopefully this medication will help you stop bleeding; then you should expect a withdrawal bleed 3 to 4 days after you finish this medication.  Restart taking the iron 1 daily.  Followup with your gynecologist.  If you feel weaker or dizzier, then go to the ER for further evaluation       ED Prescriptions     Medication Sig Dispense Auth. Provider   ferrous sulfate  325 (65 FE) MG tablet Take 1 tablet (325 mg total) by mouth daily. 30 tablet Vonna Sharlet POUR, MD   medroxyPROGESTERone  (PROVERA ) 10 MG tablet Take 1 tablet (10 mg total) by mouth daily for 10 days. 10 tablet Vonna Roshana Shuffield K, MD      PDMP not reviewed this encounter.   Vonna Sharlet POUR, MD 02/20/24 [phone]

## 2024-02-20 NOTE — ED Triage Notes (Signed)
 Pt reports been having vaginal bleeding since August. Seen at ED for it and was told blood count was low but not oo low. Pt repots having clots and still having heavy bleeding. Pt using both tampons and pads and has to change 3 within the hour. Pt reports having fatigue and dizziness as well as lower abdominal cramping.

## 2024-02-20 NOTE — Discharge Instructions (Signed)
 Your blood pressure remained good while the vital signs were done.  The pregnancy test was negative.   Staff will notify you if there is anything positive or abnormal on the swab (or on the blood work if that has been taken at this visit). It can take 2-3 days for the tests to result, depending on the day of the week your test was taken. You will only be notified if there are any positives on the testing; test results will also go to your MyChart if you are signed up for MyChart.   Take medroxyprogesterone  10 mg--1 tablet daily for 10 days.  Hopefully this medication will help you stop bleeding; then you should expect a withdrawal bleed 3 to 4 days after you finish this medication.  Restart taking the iron 1 daily.  Followup with your gynecologist.  If you feel weaker or dizzier, then go to the ER for further evaluation

## 2024-02-21 ENCOUNTER — Encounter (HOSPITAL_COMMUNITY): Payer: Self-pay

## 2024-02-21 ENCOUNTER — Ambulatory Visit (HOSPITAL_COMMUNITY): Payer: Self-pay

## 2024-02-21 LAB — CERVICOVAGINAL ANCILLARY ONLY
Chlamydia: NEGATIVE
Comment: NEGATIVE
Comment: NEGATIVE
Comment: NORMAL
Neisseria Gonorrhea: POSITIVE — AB
Trichomonas: NEGATIVE

## 2024-02-21 NOTE — Telephone Encounter (Signed)
 Please advise the patient that her vaginal bleeding is significant enough that it is causing her to have anemia.  She does appear to also be iron deficient.  At this point, she likely needs a transvaginal ultrasound to evaluate for uterine fibroids or other more serious causes of her bleeding.  Please advise her to reach out to gynecology ASAP for further evaluation.  Definitely recommend she continue iron daily and hopefully the Provera  that was prescribed during her visit will help slow her bleeding down.

## 2024-03-20 ENCOUNTER — Ambulatory Visit: Admitting: Advanced Practice Midwife

## 2024-03-21 ENCOUNTER — Ambulatory Visit (INDEPENDENT_AMBULATORY_CARE_PROVIDER_SITE_OTHER)

## 2024-03-21 ENCOUNTER — Encounter (HOSPITAL_COMMUNITY): Payer: Self-pay | Admitting: Emergency Medicine

## 2024-03-21 ENCOUNTER — Ambulatory Visit (HOSPITAL_COMMUNITY)
Admission: EM | Admit: 2024-03-21 | Discharge: 2024-03-21 | Disposition: A | Discharge status: Home / Self Care | Attending: Physician Assistant | Admitting: Physician Assistant

## 2024-03-21 DIAGNOSIS — S60221A Contusion of right hand, initial encounter: Secondary | ICD-10-CM | POA: Diagnosis not present

## 2024-03-21 DIAGNOSIS — M79641 Pain in right hand: Secondary | ICD-10-CM

## 2024-03-21 MED ORDER — NAPROXEN 500 MG PO TABS: 500.0000 mg | ORAL_TABLET | Freq: Two times a day (BID) | ORAL | 0 refills | Status: DC

## 2024-03-21 NOTE — Discharge Instructions (Addendum)
 Follow up with PCP if no improvement Follow RICE thearpy
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
## 2024-03-27 ENCOUNTER — Ambulatory Visit (HOSPITAL_COMMUNITY)
Admission: EM | Admit: 2024-03-27 | Discharge: 2024-03-27 | Disposition: A | Discharge status: Home / Self Care | Attending: Family Medicine | Admitting: Family Medicine

## 2024-03-27 ENCOUNTER — Emergency Department (HOSPITAL_COMMUNITY)
Admission: EM | Admit: 2024-03-27 | Discharge: 2024-03-27 | Disposition: A | Discharge status: Home / Self Care | Attending: Emergency Medicine | Admitting: Emergency Medicine

## 2024-03-27 ENCOUNTER — Encounter (HOSPITAL_COMMUNITY): Payer: Self-pay

## 2024-03-27 ENCOUNTER — Other Ambulatory Visit: Payer: Self-pay

## 2024-03-27 ENCOUNTER — Emergency Department (HOSPITAL_COMMUNITY)

## 2024-03-27 DIAGNOSIS — R112 Nausea with vomiting, unspecified: Secondary | ICD-10-CM

## 2024-03-27 DIAGNOSIS — R1084 Generalized abdominal pain: Secondary | ICD-10-CM | POA: Diagnosis not present

## 2024-03-27 LAB — COMPREHENSIVE METABOLIC PANEL WITH GFR
ALT: 24 U/L (ref 0–44)
AST: 36 U/L (ref 15–41)
Albumin: 4.5 g/dL (ref 3.5–5.0)
Alkaline Phosphatase: 54 U/L (ref 38–126)
Anion gap: 18 — ABNORMAL HIGH (ref 5–15)
BUN: 12 mg/dL (ref 6–20)
CO2: 20 mmol/L — ABNORMAL LOW (ref 22–32)
Calcium: 9.6 mg/dL (ref 8.9–10.3)
Chloride: 101 mmol/L (ref 98–111)
Creatinine, Ser: 0.76 mg/dL (ref 0.44–1.00)
GFR, Estimated: >60 mL/min (ref >60)
Glucose, Bld: 103 mg/dL — ABNORMAL HIGH (ref 70–99)
Potassium: 3.6 mmol/L (ref 3.5–5.1)
Sodium: 139 mmol/L (ref 135–145)
Total Bilirubin: 0.8 mg/dL (ref 0.0–1.2)
Total Protein: 7.9 g/dL (ref 6.5–8.1)

## 2024-03-27 LAB — LIPASE, BLOOD: Lipase: 11 U/L (ref 11–51)

## 2024-03-27 LAB — URINALYSIS, ROUTINE W REFLEX MICROSCOPIC
Bilirubin Urine: NEGATIVE
Glucose, UA: NEGATIVE mg/dL
Hgb urine dipstick: NEGATIVE
Ketones, ur: 20 mg/dL — AB
Leukocytes,Ua: NEGATIVE
Nitrite: NEGATIVE
Protein, ur: 100 mg/dL — AB
Specific Gravity, Urine: 1.025 (ref 1.005–1.030)
pH: 9 — ABNORMAL HIGH (ref 5.0–8.0)

## 2024-03-27 LAB — CBC
HCT: 39.2 % (ref 36.0–46.0)
Hemoglobin: 13 g/dL (ref 12.0–15.0)
MCH: 26.9 pg (ref 26.0–34.0)
MCHC: 33.2 g/dL (ref 30.0–36.0)
MCV: 81 fL (ref 80.0–100.0)
Platelets: 285 K/uL (ref 150–400)
RBC: 4.84 MIL/uL (ref 3.87–5.11)
RDW: 20.5 % — ABNORMAL HIGH (ref 11.5–15.5)
WBC: 9.6 K/uL (ref 4.0–10.5)
nRBC: 0 % (ref 0.0–0.2)

## 2024-03-27 LAB — HCG, SERUM, QUALITATIVE: Preg, Serum: NEGATIVE

## 2024-03-27 MED ORDER — DROPERIDOL 2.5 MG/ML IJ SOLN
0.6250 mg | Freq: Once | INTRAMUSCULAR | Status: AC
  Administered 2024-03-27: 0.625 mg via INTRAVENOUS
  Filled 2024-03-27: qty 2

## 2024-03-27 MED ORDER — ONDANSETRON HCL 4 MG PO TABS: 4.0000 mg | ORAL_TABLET | Freq: Four times a day (QID) | ORAL | 0 refills | Status: DC

## 2024-03-27 MED ORDER — IOHEXOL 350 MG/ML SOLN
75.0000 mL | Freq: Once | INTRAVENOUS | Status: AC | PRN
  Administered 2024-03-27: 75 mL via INTRAVENOUS

## 2024-03-27 MED ORDER — ONDANSETRON HCL 4 MG/2ML IJ SOLN
INTRAMUSCULAR | Status: AC
  Filled 2024-03-27: qty 2

## 2024-03-27 MED ORDER — ONDANSETRON HCL 4 MG/2ML IJ SOLN
4.0000 mg | Freq: Once | INTRAMUSCULAR | Status: AC
  Administered 2024-03-27: 4 mg via INTRAMUSCULAR

## 2024-03-27 NOTE — ED Triage Notes (Signed)
 Pt c/o vomiting since 10pm last night. C/o abdominal pain with chills, and SOB. Pt states took zofran  today. States feels like she is going to pass out.

## 2024-03-27 NOTE — ED Triage Notes (Signed)
 Patient bib carelink she sent from urgent care for Nausea vomiting and abdominal pain. Had one episode of emesis at urgent care. UC sent her to ED for possibly needing a scan.  Received 4mg  of Zofran  IM at urgent care before transfer.   Patient states that symptoms started last night and have gotten worse.

## 2024-03-27 NOTE — ED Notes (Signed)
 Patient transported to CT

## 2024-03-27 NOTE — Discharge Instructions (Addendum)
 Labs and CT scan are unremarkable today.  I prescribed Zofran , take it every 6 hours as needed for nausea.  Monitor for worsening symptoms and return to the ED if concerning symptoms arise.

## 2024-03-27 NOTE — Discharge Instructions (Signed)
 Meds ordered this encounter  Medications   ondansetron  (ZOFRAN ) injection 4 mg

## 2024-03-27 NOTE — ED Provider Notes (Signed)
 Care seen from previous provider.  See note for full HPI.  Patient seen for abdominal pain, nausea and vomiting  Labs and imaging reassuring.  Tolerating p.o. intake.  Plan to follow-up on lipase, DC once lipase results Physical Exam  BP 120/77 (BP Location: Right Arm)   Pulse 79   Temp 98.6 F (37 C) (Oral)   Resp 18   Ht 5' (1.524 m)   Wt 41.7 kg   LMP 02/01/2024 (Approximate)   SpO2 98%   BMI 17.97 kg/m   Physical Exam  Procedures  Procedures Labs Reviewed  COMPREHENSIVE METABOLIC PANEL WITH GFR - Abnormal; Notable for the following components:      Result Value   CO2 20 (*)    Glucose, Bld 103 (*)    Anion gap 18 (*)    All other components within normal limits  CBC - Abnormal; Notable for the following components:   RDW 20.5 (*)    All other components within normal limits  URINALYSIS, ROUTINE W REFLEX MICROSCOPIC - Abnormal; Notable for the following components:   APPearance HAZY (*)    pH 9.0 (*)    Ketones, ur 20 (*)    Protein, ur 100 (*)    Bacteria, UA RARE (*)    All other components within normal limits  LIPASE, BLOOD  HCG, SERUM, QUALITATIVE   CT ABDOMEN PELVIS W CONTRAST Result Date: 03/27/2024 CLINICAL DATA:  Acute abdominal pain. EXAM: CT ABDOMEN AND PELVIS WITH CONTRAST TECHNIQUE: Multidetector CT imaging of the abdomen and pelvis was performed using the standard protocol following bolus administration of intravenous contrast. RADIATION DOSE REDUCTION: This exam was performed according to the departmental dose-optimization program which includes automated exposure control, adjustment of the mA and/or kV according to patient size and/or use of iterative reconstruction technique. CONTRAST:  75mL OMNIPAQUE IOHEXOL 350 MG/ML SOLN COMPARISON:  December 13, 2023 pelvic ultrasound FINDINGS: Lower chest: Unremarkable. Hepatobiliary: No suspicious liver lesion. No gallstones, gallbladder wall thickening, or biliary dilatation. Pancreas: Unremarkable. Spleen:  Unremarkable. Adrenals/Urinary Tract: Adrenal glands are unremarkable. No nephrolithiasis or hydronephrosis. Bladder is decompressed, limiting evaluation. Stomach/Bowel: No evidence of bowel obstruction or inflammation. Appendix is unremarkable. Vascular/Lymphatic: Normal caliber aorta. No lymphadenopathy by size criteria. Reproductive: 3.5 cm left ovarian cyst. Uterus and right ovary are unremarkable. Other: Trace pelvic free fluid, likely physiologic. Small volume intramuscular gas in the right gluteal musculature. No free intraperitoneal air. Musculoskeletal: No acute osseous findings. IMPRESSION: 1. Small volume intramuscular gas in the right gluteal musculature, query recent intramuscular injection. Alternatively, this could be related to of soft tissue infection. Recommend correlation with physical exam. 2. 3.5 cm left ovarian cyst. No follow-up imaging recommended. Note: This recommendation does not apply to premenarchal patients and to those with increased risk (genetic, family history, elevated tumor markers or other high-risk factors) of ovarian cancer. Reference: JACR 2020 Feb; 17(2):248-254 Electronically Signed   By: Michaeline Blanch M.D.   On: 03/27/2024 13:53   DG Hand Complete Right Result Date: 03/21/2024 CLINICAL DATA:  Punched object 2 days ago.  Right hand pain. EXAM: RIGHT HAND - COMPLETE 3+ VIEW COMPARISON:  None Available. FINDINGS: Mild soft tissue swelling over the MCP joints. No acute bony abnormality. Specifically, no fracture, subluxation, or dislocation. IMPRESSION: No acute bony abnormality. Electronically Signed   By: Franky Crease M.D.   On: 03/21/2024 18:17    ED Course / MDM   Care seen from previous provider.  See note for full HPI.  Patient seen for abdominal  pain, nausea and vomiting  Labs and imaging reassuring.  No focal pain. Tolerating p.o. intake.  Plan to follow-up on lipase, DC once lipase results  Lipase WNL  Dc home    Clinical Course as of 03/27/24 2356   Tue Mar 27, 2024  1602 FU on lipase and likely dc home. [BH]    Clinical Course User Index [BH] Danasia Baker A, PA-C   Medical Decision Making Amount and/or Complexity of Data Reviewed Labs:  Decision-making details documented in ED Course. Radiology:  Decision-making details documented in ED Course.  Risk Prescription drug management.          Ronnika Collett A, PA-C 03/27/24 2356    Lowther, Amy, DO 03/28/24 1610

## 2024-03-27 NOTE — ED Provider Triage Note (Signed)
 Emergency Medicine Provider Triage Evaluation Note  Kelli Sellers , a 23 y.o. female  was evaluated in triage.  Pt complains of abdominal pain and persistent vomiting that began suddenly last night.  She does endorse some EtOH intake last night, last oral intake was yesterday evening.  She also endorses cannabis use stating that her last use was yesterday evening.  Presented to urgent care today out of concern for persistent vomiting, stated she has vomited 7-8 times today, denies any hematemesis..  Review of Systems  Positive: As above Negative:   Physical Exam  BP 103/77 (BP Location: Right Arm)   Pulse 60   Temp 98.6 F (37 C) (Oral)   Resp 20   Ht 5' (1.524 m)   Wt 41.7 kg   LMP 02/01/2024 (Approximate)   SpO2 100%   BMI 17.97 kg/m  Gen:   Awake, no distress   Resp:  Normal effort  MSK:   Moves extremities without difficulty  Other:  Mild diffuse abdominal tenderness appreciated.  Medical Decision Making  Medically screening exam initiated at 12:03 PM.  Appropriate orders placed.  Kelli Sellers was informed that the remainder of the evaluation will be completed by another provider, this initial triage assessment does not replace that evaluation, and the importance of remaining in the ED until their evaluation is complete.  Initial order sets for abdominal pain placed.  Given droperidol for nausea as she is already received for Zofran  from urgent care, still having nausea despite this.   Myriam Dorn BROCKS, [place] 03/27/24 [phone]

## 2024-03-27 NOTE — ED Provider Notes (Addendum)
  [facility]   [id number] 03/27/24 Arrival Time: 1027  ASSESSMENT & PLAN:  1. Generalized abdominal pain   2. Nausea and vomiting, unspecified vomiting type    Seen in triage. In wheelchair doubled over with emesis bag. Able to sit straight. Reports generalized abd pain with palpation; with voluntary guarding; with associated non-bilious, non-bloody emesis. Reports abrupt onset of pain yesterday evening; worsening this morning. Denies diarrhea. Does smoke THC daily. Denies any heavy alcohol use. One drink yesterday. Reports taking 4mg  Zofran  ODT PTA. 4mg  Zofran  IM given here. Limited diagnostic capabilities here; to ED via [facility]; stable upon discharge. Unable to give urine for UPT.  Meds ordered this encounter  Medications   ondansetron  (ZOFRAN ) injection 4 mg    Reviewed expectations re: course of current medical issues. Questions answered. Outlined signs and symptoms indicating need for more acute intervention. Patient verbalized understanding. After Visit Summary given.   SUBJECTIVE: History from: patient. Kelli Sellers is a 23 y.o. female. See above.  Patient's last menstrual period was 02/01/2024 (approximate). Past Surgical History:  Procedure Laterality Date   HAND SURGERY     spider bite infection       OBJECTIVE:  Vitals:   03/27/24 1114  BP: 113/69  Pulse: 60  Resp: 18  Temp: 97.6 F (36.4 C)  TempSrc: Temporal  SpO2: 100%    General appearance: alert, oriented, appears to be in pain Skin: warm and dry Psychological: alert and cooperative; normal mood and affect   No Known Allergies                                             History reviewed. No pertinent past medical history.  Social History   Socioeconomic History   Marital status: Single    Spouse name: Not on file   Number of children: Not on file   Years of education: Not on file   Highest education level: Not on file  Occupational History   Not on file  Tobacco Use    Smoking status: Never    Passive exposure: Yes   Smokeless tobacco: Never  Vaping Use   Vaping status: Never Used  Substance and Sexual Activity   Alcohol use: Yes    Comment: socially   Drug use: Yes    Types: Marijuana   Sexual activity: Yes    Birth control/protection: None  Other Topics Concern   Not on file  Social History Narrative   ** Merged History Encounter **       Social Drivers of Health   Financial Resource Strain: Not on file  Food Insecurity: Not on file  Transportation Needs: Not on file  Physical Activity: Not on file  Stress: Not on file  Social Connections: Not on file  Intimate Partner Violence: Not on file    Family History  Problem Relation Age of Onset   Healthy Mother    Healthy Father      Rolinda Rogue, MD 03/27/24 1125    Rolinda Rogue, MD 03/27/24 1126

## 2024-03-27 NOTE — ED Notes (Signed)
 Patient is being discharged from the Urgent Care and sent to the Emergency Department via [facility] . Per Dr. Rolinda, patient is in need of higher level of care due to vomiting and abdominal pain. Patient is aware and verbalizes understanding of plan of care.  Vitals:   03/27/24 1114  BP: 113/69  Pulse: 60  Resp: 18  Temp: 97.6 F (36.4 C)  SpO2: 100%

## 2024-03-27 NOTE — ED Provider Notes (Signed)
 [place] [facility] AT [facility] Provider Note   CSN: [id number] Arrival date & time: 03/27/24  1140     Patient presents with: No chief complaint on file.   Kelli Sellers is a 23 y.o. female.   23 year old female presents to the ED with complaints of generalized abdominal pain.  She went to go see urgent care and they sent her here via [facility] for further evaluation and CT scan.  Patient has been complaining of nausea and vomiting and constipation since yesterday.  Patient denies any significant medical history or abdominal surgeries.  Patient has been given Zofran  prior to arrival with relief of nausea.     Prior to Admission medications   Medication Sig Start Date End Date Taking? Authorizing Provider  ondansetron  (ZOFRAN ) 4 MG tablet Take 1 tablet (4 mg total) by mouth every 6 (six) hours. 03/27/24  Yes Myriam Fonda RAMAN, PA-C  acetaminophen  (TYLENOL ) 500 MG tablet Take 2 tablets (1,000 mg total) by mouth every 6 (six) hours as needed. 02/14/22   Enedelia Dorna CHRISTELLA, FNP  ferrous sulfate  325 (65 FE) MG tablet Take 1 tablet (325 mg total) by mouth daily. 02/20/24   Vonna Sharlet POUR, MD  naproxen  (NAPROSYN ) 500 MG tablet Take 1 tablet (500 mg total) by mouth 2 (two) times daily. 03/21/24   Juleen Rush, PA-C    Allergies: Patient has no known allergies.    Review of Systems  Gastrointestinal:  Positive for abdominal pain, nausea and vomiting.  All other systems reviewed and are negative.   Updated Vital Signs BP 120/77 (BP Location: Right Arm)   Pulse 79   Temp 98.6 F (37 C) (Oral)   Resp 18   Ht 5' (1.524 m)   Wt 41.7 kg   LMP 02/01/2024 (Approximate)   SpO2 98%   BMI 17.97 kg/m   Physical Exam Vitals and nursing note reviewed.  Constitutional:      Appearance: Normal appearance.  HENT:     Head: Normocephalic and atraumatic.     Nose: Nose normal.  Eyes:     Extraocular Movements: Extraocular movements intact.      Conjunctiva/sclera: Conjunctivae normal.     Pupils: Pupils are equal, round, and reactive to light.  Cardiovascular:     Rate and Rhythm: Normal rate.  Pulmonary:     Effort: Pulmonary effort is normal. No respiratory distress.     Breath sounds: Normal breath sounds.  Abdominal:     General: Abdomen is flat.     Tenderness: There is no abdominal tenderness. There is no guarding.  Musculoskeletal:        General: Normal range of motion.     Cervical back: Normal range of motion.  Skin:    General: Skin is warm.     Capillary Refill: Capillary refill takes less than 2 seconds.  Neurological:     General: No focal deficit present.     Mental Status: She is alert and oriented to person, place, and time.  Psychiatric:        Mood and Affect: Mood normal.        Behavior: Behavior normal.     (all labs ordered are listed, but only abnormal results are displayed) Labs Reviewed  COMPREHENSIVE METABOLIC PANEL WITH GFR - Abnormal; Notable for the following components:      Result Value   CO2 20 (*)    Glucose, Bld 103 (*)    Anion gap 18 (*)  All other components within normal limits  CBC - Abnormal; Notable for the following components:   RDW 20.5 (*)    All other components within normal limits  URINALYSIS, ROUTINE W REFLEX MICROSCOPIC - Abnormal; Notable for the following components:   APPearance HAZY (*)    pH 9.0 (*)    Ketones, ur 20 (*)    Protein, ur 100 (*)    Bacteria, UA RARE (*)    All other components within normal limits  LIPASE, BLOOD  HCG, SERUM, QUALITATIVE    EKG: None  Radiology: CT ABDOMEN PELVIS W CONTRAST Result Date: 03/27/2024 CLINICAL DATA:  Acute abdominal pain. EXAM: CT ABDOMEN AND PELVIS WITH CONTRAST TECHNIQUE: Multidetector CT imaging of the abdomen and pelvis was performed using the standard protocol following bolus administration of intravenous contrast. RADIATION DOSE REDUCTION: This exam was performed according to the departmental  dose-optimization program which includes automated exposure control, adjustment of the mA and/or kV according to patient size and/or use of iterative reconstruction technique. CONTRAST:  75mL OMNIPAQUE IOHEXOL 350 MG/ML SOLN COMPARISON:  December 13, 2023 pelvic ultrasound FINDINGS: Lower chest: Unremarkable. Hepatobiliary: No suspicious liver lesion. No gallstones, gallbladder wall thickening, or biliary dilatation. Pancreas: Unremarkable. Spleen: Unremarkable. Adrenals/Urinary Tract: Adrenal glands are unremarkable. No nephrolithiasis or hydronephrosis. Bladder is decompressed, limiting evaluation. Stomach/Bowel: No evidence of bowel obstruction or inflammation. Appendix is unremarkable. Vascular/Lymphatic: Normal caliber aorta. No lymphadenopathy by size criteria. Reproductive: 3.5 cm left ovarian cyst. Uterus and right ovary are unremarkable. Other: Trace pelvic free fluid, likely physiologic. Small volume intramuscular gas in the right gluteal musculature. No free intraperitoneal air. Musculoskeletal: No acute osseous findings. IMPRESSION: 1. Small volume intramuscular gas in the right gluteal musculature, query recent intramuscular injection. Alternatively, this could be related to of soft tissue infection. Recommend correlation with physical exam. 2. 3.5 cm left ovarian cyst. No follow-up imaging recommended. Note: This recommendation does not apply to premenarchal patients and to those with increased risk (genetic, family history, elevated tumor markers or other high-risk factors) of ovarian cancer. Reference: JACR 2020 Feb; 17(2):248-254 Electronically Signed   By: Michaeline Blanch M.D.   On: 03/27/2024 13:53     Procedures   Medications Ordered in the ED  droperidol (INAPSINE) 2.5 MG/ML injection 0.625 mg (0.625 mg Intravenous Given 03/27/24 1216)  iohexol (OMNIPAQUE) 350 MG/ML injection 75 mL (75 mLs Intravenous Contrast Given 03/27/24 1319)   23 y.o. female presents to the ED with complaints of abdominal  pain, nausea, vomiting, this involves an extensive number of treatment options, and is a complaint that carries with it a high risk of complications and morbidity.  The differential diagnosis includes gastritis, appendicitis, cholecystitis, colitis, bowel obstruction, UTI,, (Ddx)  On arrival pt is nontoxic, vitals unremarkable.  Additional history obtained from chart review significant for follow-up by OB/GYN for routine visit.  Lab Tests:  I Ordered, reviewed, and interpreted labs, which included: CMP, CBC, UA, hCG, lipase  Imaging Studies ordered:  I ordered imaging studies which included CT abdomen pelvis, I independently visualized and interpreted imaging which showed no acute abdominal etiology.  ED Course:   Patient is sitting comfortably in ED bed nontoxic-appearing and in no acute distress.  On abdominal exam patient has very mild pain to palpation throughout abdomen.  No Murphy's or McBurney's noted on abdominal exam.  No CVA tenderness.  Patient denies any UTI symptoms.  Lungs are clear to auscultation all fields and patient denies any chest pain.  Patient advised sore throat from hyperemesis  and tonsils and mouth are nonerythematous with no edema.  During an initial exam patient keeps repeating when she is getting her scan she is ready to go.  Patient was advised when we have appropriate lab work back we can get her scan done.  On initial exam patient denies nausea because of medication.  On reevaluation patient was sitting comfortable in ED bed.  Exam findings were discussed with patient and advised to take outpatient Zofran  that would be prescribed and monitor for worsening symptoms.  Patient was advised of findings of ovarian cyst on CT scan and advised to follow-up with OB/GYN for monitoring.  Patient agreed with treatment plan and was comfortable for discharge.      Portions of this note were generated with Scientist, clinical (histocompatibility and immunogenetics). Dictation errors may occur despite best  attempts at proofreading.    Final diagnoses:  Generalized abdominal pain    ED Discharge Orders          Ordered    ondansetron  (ZOFRAN ) 4 MG tablet  Every 6 hours        03/27/24 1447               Kacy Hegna S, PA-C 03/28/24 1722    Laurice Maude BROCKS, MD 03/28/24 1825

## 2024-03-27 NOTE — ED Notes (Signed)
 Report given to [place], Charity fundraiser CN at [facility]

## 2024-04-08 ENCOUNTER — Encounter: Payer: Self-pay | Admitting: Obstetrics and Gynecology

## 2024-04-19 ENCOUNTER — Ambulatory Visit: Admitting: Obstetrics

## 2024-04-29 ENCOUNTER — Other Ambulatory Visit: Payer: Self-pay

## 2024-04-29 ENCOUNTER — Encounter (HOSPITAL_COMMUNITY): Payer: Self-pay | Admitting: Emergency Medicine

## 2024-04-29 ENCOUNTER — Ambulatory Visit (HOSPITAL_COMMUNITY)
Admission: EM | Admit: 2024-04-29 | Discharge: 2024-04-29 | Disposition: A | Discharge status: Home / Self Care | Attending: Emergency Medicine | Admitting: Emergency Medicine

## 2024-04-29 DIAGNOSIS — R1032 Left lower quadrant pain: Secondary | ICD-10-CM | POA: Diagnosis not present

## 2024-04-29 DIAGNOSIS — R110 Nausea: Secondary | ICD-10-CM | POA: Diagnosis not present

## 2024-04-29 DIAGNOSIS — N83202 Unspecified ovarian cyst, left side: Secondary | ICD-10-CM

## 2024-04-29 LAB — POCT URINE PREGNANCY: Preg Test, Ur: NEGATIVE

## 2024-04-29 MED ORDER — KETOROLAC TROMETHAMINE 10 MG PO TABS: 10.0000 mg | ORAL_TABLET | Freq: Four times a day (QID) | ORAL | 0 refills | Status: AC | PRN

## 2024-04-29 MED ORDER — ONDANSETRON 4 MG PO TBDP
4.0000 mg | ORAL_TABLET | Freq: Once | ORAL | Status: AC
  Administered 2024-04-29: 4 mg via ORAL

## 2024-04-29 MED ORDER — ONDANSETRON 4 MG PO TBDP: 4.0000 mg | ORAL_TABLET | Freq: Four times a day (QID) | ORAL | 0 refills | Status: AC | PRN

## 2024-04-29 MED ORDER — ONDANSETRON 4 MG PO TBDP
ORAL_TABLET | ORAL | Status: AC
Start: 2024-04-29 — End: 2024-04-29
  Filled 2024-04-29: qty 1

## 2024-04-29 MED ORDER — KETOROLAC TROMETHAMINE 30 MG/ML IJ SOLN
30.0000 mg | Freq: Once | INTRAMUSCULAR | Status: AC
  Administered 2024-04-29: 30 mg via INTRAMUSCULAR

## 2024-04-29 MED ORDER — KETOROLAC TROMETHAMINE 30 MG/ML IJ SOLN
INTRAMUSCULAR | Status: AC
  Filled 2024-04-29: qty 1

## 2024-04-29 NOTE — Discharge Instructions (Addendum)
 The Toradol injection given today should start to work in about 30 minutes. I am starting you on the oral version of toradol-- one tablet every 6 hours for 3 days in a row. Take with food.   Please do not use any NSAIDs (ibuprofen /Advil , naproxen /Aleve , etc) while taking this. You can safely use tylenol    The zofran  can be used every 6 hours as needed to settle the stomach  Please call your ob/gyn for follow up. Please go to the emergency department if symptoms worsen - especially with severe pain or inability to tolerate fluids.

## 2024-04-29 NOTE — ED Provider Notes (Addendum)
 [facility]    CSN: [id number] Arrival date & time: 04/29/24  9160      History   Chief Complaint Chief Complaint  Patient presents with   Abdominal Pain    HPI CRESTINA STRIKE is a 23 y.o. female.  Abdominal pain started last night. Located left lower. Associated nausea. Episode of emesis just once last night. Tolerating fluids last night and today. No diarrhea or fever  On 9/30 had CT scan for abd pain and found to have 3.5 cm left ovarian cyst.   She no-showed her ob/gyn appointment on 10/23  History of irregular menses LMP 8/4  Denies vaginal or urinary symptoms   History reviewed. No pertinent past medical history.  There are no active problems to display for this patient.   Past Surgical History:  Procedure Laterality Date   HAND SURGERY     spider bite infection      OB History     Gravida  0   Para  0   Term  0   Preterm  0   AB  0   Living  0      SAB  0   IAB  0   Ectopic  0   Multiple  0   Live Births  0            Home Medications    Prior to Admission medications   Medication Sig Start Date End Date Taking? Authorizing Provider  ketorolac (TORADOL) 10 MG tablet Take 1 tablet (10 mg total) by mouth every 6 (six) hours as needed for up to 3 days. 04/29/24 05/02/24 Yes Posie Lillibridge, Asberry, PA-C  ondansetron  (ZOFRAN -ODT) 4 MG disintegrating tablet Take 1 tablet (4 mg total) by mouth every 6 (six) hours as needed for nausea or vomiting. 04/29/24  Yes Shaft Corigliano, Asberry, PA-C    Family History Family History  Problem Relation Age of Onset   Healthy Mother    Healthy Father     Social History Social History   Tobacco Use   Smoking status: Never    Passive exposure: Yes   Smokeless tobacco: Never  Vaping Use   Vaping status: Never Used  Substance Use Topics   Alcohol use: Yes    Comment: socially   Drug use: Yes    Types: Marijuana     Allergies   Patient has no known allergies.   Review of  Systems Review of Systems  Gastrointestinal:  Positive for abdominal pain.   As per HPI   Physical Exam Triage Vital Signs ED Triage Vitals  Encounter Vitals Group     BP 04/29/24 0936 131/89     Girls Systolic BP Percentile --      Girls Diastolic BP Percentile --      Boys Systolic BP Percentile --      Boys Diastolic BP Percentile --      Pulse Rate 04/29/24 0936 61     Resp 04/29/24 0936 18     Temp 04/29/24 0936 98.4 F (36.9 C)     Temp Source 04/29/24 0936 Oral     SpO2 04/29/24 0936 96 %     Weight --      Height --      Head Circumference --      Peak Flow --      Pain Score 04/29/24 0933 10     Pain Loc --      Pain Education --      Exclude  from Growth Chart --    No data found.  Updated Vital Signs BP 131/89 (BP Location: Right Arm)   Pulse 61   Temp 98.4 F (36.9 C) (Oral)   Resp 18   LMP 01/30/2024   SpO2 96%   Visual Acuity Right Eye Distance:   Left Eye Distance:   Bilateral Distance:    Right Eye Near:   Left Eye Near:    Bilateral Near:     Physical Exam Vitals and nursing note reviewed.  Constitutional:      General: She is not in acute distress.    Appearance: Normal appearance. She is not ill-appearing or diaphoretic.  HENT:     Mouth/Throat:     Mouth: Mucous membranes are moist.     Pharynx: Oropharynx is clear. No posterior oropharyngeal erythema.  Eyes:     Conjunctiva/sclera: Conjunctivae normal.  Cardiovascular:     Rate and Rhythm: Normal rate and regular rhythm.     Pulses: Normal pulses.     Heart sounds: Normal heart sounds.  Pulmonary:     Effort: Pulmonary effort is normal.     Breath sounds: Normal breath sounds.  Abdominal:     General: Abdomen is flat. Bowel sounds are normal. There is no distension. There are no signs of injury.     Palpations: Abdomen is soft.     Tenderness: There is no abdominal tenderness. There is no right CVA tenderness, left CVA tenderness, guarding or rebound.     Hernia: No hernia  is present.      Comments: No guarding or rebound   Musculoskeletal:        General: Normal range of motion.     Cervical back: Normal range of motion.  Lymphadenopathy:     Cervical: No cervical adenopathy.  Skin:    General: Skin is warm and dry.  Neurological:     Mental Status: She is alert and oriented to person, place, and time.      UC Treatments / Results  Labs (all labs ordered are listed, but only abnormal results are displayed) Labs Reviewed  POCT URINE PREGNANCY    EKG  Radiology No results found.  Procedures Procedures (including critical care time)  Medications Ordered in UC Medications  ondansetron  (ZOFRAN -ODT) disintegrating tablet 4 mg (4 mg Oral Given 04/29/24 1023)  ketorolac (TORADOL) 30 MG/ML injection 30 mg (30 mg Intramuscular Given 04/29/24 1023)    Initial Impression / Assessment and Plan / UC Course  I have reviewed the triage vital signs and the nursing notes.  Pertinent labs & imaging results that were available during my care of the patient were reviewed by me and considered in my medical decision making (see chart for details).  UPT negative  Afebrile, stable vitals, reassuring exam No abdominal tenderness, guarding, rebound Zofran  ODT given for nausea, IM toradol for pain Pain is same area she has 3.5 cm ovarian cyst. Discussed pain control at home - oral toradol 10 mg TID prn for 3 days. Continue zofran  q6 hours prn. Call ob/gyn to schedule follow up. Strict ED precautions. Agrees to plan. No questions at this time  Final Clinical Impressions(s) / UC Diagnoses   Final diagnoses:  Nausea  Abdominal pain, left lower quadrant  Left ovarian cyst     Discharge Instructions      The Toradol injection given today should start to work in about 30 minutes. I am starting you on the oral version of toradol-- one tablet every 6  hours for 3 days in a row. Take with food.   Please do not use any NSAIDs (ibuprofen /Advil , naproxen /Aleve ,  etc) while taking this. You can safely use tylenol    The zofran  can be used every 6 hours as needed to settle the stomach  Please call your ob/gyn for follow up. Please go to the emergency department if symptoms worsen - especially with severe pain or inability to tolerate fluids.      ED Prescriptions     Medication Sig Dispense Auth. Provider   ketorolac (TORADOL) 10 MG tablet Take 1 tablet (10 mg total) by mouth every 6 (six) hours as needed for up to 3 days. 12 tablet Nanna Ertle, PA-C   ondansetron  (ZOFRAN -ODT) 4 MG disintegrating tablet Take 1 tablet (4 mg total) by mouth every 6 (six) hours as needed for nausea or vomiting. 20 tablet Kiyan Burmester, Asberry, PA-C      PDMP not reviewed this encounter.     Jeryl Asberry RIGGERS 04/29/24 1126

## 2024-04-29 NOTE — ED Triage Notes (Signed)
 Abdominal pain and vomiting yesterday.  Last night was able to hold down water and liquids.  Last night, pain became very specific to left lower abdomen.  This is severe pain.  No longer vomiting, but is nauseated.  Last BM 2-3 days ago.  Patient has had concerns for constipation.  Patient was recently told she has an ovarian cyst.    Has not taken any medications.

## 2024-05-31 ENCOUNTER — Ambulatory Visit: Admitting: Obstetrics and Gynecology

## 2024-05-31 ENCOUNTER — Encounter: Payer: Self-pay | Admitting: Obstetrics and Gynecology

## 2024-05-31 VITALS — BP 111/70 | HR 81 | Ht 62.0 in | Wt 103.6 lb

## 2024-05-31 DIAGNOSIS — Z789 Other specified health status: Secondary | ICD-10-CM

## 2024-05-31 DIAGNOSIS — N911 Secondary amenorrhea: Secondary | ICD-10-CM | POA: Insufficient documentation

## 2024-05-31 DIAGNOSIS — N83299 Other ovarian cyst, unspecified side: Secondary | ICD-10-CM

## 2024-05-31 LAB — POCT URINE PREGNANCY: Preg Test, Ur: NEGATIVE

## 2024-05-31 NOTE — Progress Notes (Unsigned)
 Pt states she hasn't had a cycle since September. She reports her cycle has always been irregular.   Pt states she went to [facility] for pelvic pain last month, CT scan was done, pt reports cyst on left ovary.   Pt still experiencing pain at times. Hospital told pt to came here for f/u.

## 2024-05-31 NOTE — Progress Notes (Unsigned)
  GYNECOLOGY PROGRESS NOTE  History:  Ms. Kelli Sellers is a 23 y.o. G0P0000 presents to [facility] office today for problem gyn visit. She reports she had ovarian cysts noted on U/S in September. She reports not having a period since September.  She denies h/a, dizziness, shortness of breath, n/v, or fever/chills.    The following portions of the patient's history were reviewed and updated as appropriate: allergies, current medications, past family history, past medical history, past social history, past surgical history and problem list.   Review of Systems:  Pertinent items are noted in HPI.   Objective:  Physical Exam Blood pressure 111/70, pulse 81, height 5' 2 (1.575 m), weight 103 lb 9.6 oz (47 kg). VS reviewed, nursing note reviewed,  Constitutional: well developed, well nourished, no distress HEENT: normocephalic CV: normal rate Pulm/chest wall: normal effort Breast Exam: deferred Abdomen: soft without tenderness, enlargement, or mass Neuro: alert and oriented x 3 Skin: warm, dry Psych: affect normal Pelvic exam: deferred   Assessment & Plan:  1. Amenorrhea, secondary (Primary) - POCT urine pregnancy  2. Not currently pregnant  3. Functional ovarian cysts - Pelvic U/S ordered  Zollie Ellery, CNM 1:39 PM

## 2024-06-01 ENCOUNTER — Encounter: Payer: Self-pay | Admitting: Obstetrics and Gynecology

## 2024-06-14 ENCOUNTER — Ambulatory Visit (HOSPITAL_COMMUNITY)
Admission: RE | Admit: 2024-06-14 | Discharge: 2024-06-14 | Attending: Obstetrics and Gynecology | Admitting: Obstetrics and Gynecology

## 2024-06-14 DIAGNOSIS — N911 Secondary amenorrhea: Secondary | ICD-10-CM | POA: Diagnosis present

## 2024-07-02 ENCOUNTER — Ambulatory Visit: Payer: Self-pay | Admitting: Obstetrics and Gynecology

## 2024-07-02 ENCOUNTER — Encounter: Payer: Self-pay | Admitting: Obstetrics and Gynecology

## 2024-07-02 DIAGNOSIS — E282 Polycystic ovarian syndrome: Secondary | ICD-10-CM | POA: Insufficient documentation

## 2024-07-16 ENCOUNTER — Other Ambulatory Visit: Payer: Self-pay

## 2024-07-16 ENCOUNTER — Encounter: Payer: Self-pay | Admitting: Obstetrics

## 2024-07-16 ENCOUNTER — Ambulatory Visit: Payer: Self-pay | Admitting: Obstetrics

## 2024-07-16 VITALS — BP 101/63 | HR 58 | Ht 61.0 in | Wt 101.4 lb

## 2024-07-16 DIAGNOSIS — Z3009 Encounter for other general counseling and advice on contraception: Secondary | ICD-10-CM

## 2024-07-16 DIAGNOSIS — E282 Polycystic ovarian syndrome: Secondary | ICD-10-CM

## 2024-07-16 DIAGNOSIS — N921 Excessive and frequent menstruation with irregular cycle: Secondary | ICD-10-CM | POA: Diagnosis not present

## 2024-07-16 DIAGNOSIS — N898 Other specified noninflammatory disorders of vagina: Secondary | ICD-10-CM

## 2024-07-16 DIAGNOSIS — Z8619 Personal history of other infectious and parasitic diseases: Secondary | ICD-10-CM | POA: Diagnosis not present

## 2024-07-16 DIAGNOSIS — L680 Hirsutism: Secondary | ICD-10-CM | POA: Diagnosis not present

## 2024-07-16 DIAGNOSIS — R4589 Other symptoms and signs involving emotional state: Secondary | ICD-10-CM | POA: Diagnosis not present

## 2024-07-16 DIAGNOSIS — Z30011 Encounter for initial prescription of contraceptive pills: Secondary | ICD-10-CM

## 2024-07-16 DIAGNOSIS — Z131 Encounter for screening for diabetes mellitus: Secondary | ICD-10-CM

## 2024-07-16 MED ORDER — METFORMIN HCL ER 500 MG PO TB24: 1500.0000 mg | ORAL_TABLET | Freq: Every day | ORAL | 11 refills | Status: AC

## 2024-07-16 MED ORDER — DROSPIRENONE-ETHINYL ESTRADIOL 3-0.02 MG PO TABS: 1.0000 | ORAL_TABLET | Freq: Every day | ORAL | 11 refills | Status: AC

## 2024-07-16 NOTE — Progress Notes (Signed)
 Pt presents for pcos f/u. Pt had u/s done on 12/18 that confirmed pcos. Pt wants to know what is next.

## 2024-07-16 NOTE — Progress Notes (Signed)
 Patient ID: Kelli Sellers, female   DOB: 2001-03-28, 24 y.o.   MRN: [id number]  Chief Complaint  Patient presents with   Follow-up    PCOS    HPI Kelli Sellers is a 24 y.o. female.  Presents for ultrasound results.  She has a history of PCOS. HPI  History reviewed. No pertinent past medical history.  Past Surgical History:  Procedure Laterality Date   HAND SURGERY     spider bite infection      Family History  Problem Relation Age of Onset   Healthy Mother    Healthy Father     Social History Social History[1]  Allergies[2]  Current Outpatient Medications  Medication Sig Dispense Refill   drospirenone -ethinyl estradiol  (YAZ) 3-0.02 MG tablet Take 1 tablet by mouth daily. 28 tablet 11   metFORMIN  (GLUCOPHAGE -XR) 500 MG 24 hr tablet Take 3 tablets (1,500 mg total) by mouth daily after supper. 90 tablet 11   ondansetron  (ZOFRAN -ODT) 4 MG disintegrating tablet Take 1 tablet (4 mg total) by mouth every 6 (six) hours as needed for nausea or vomiting. 20 tablet 0   No current facility-administered medications for this visit.    Review of Systems Review of Systems Constitutional: negative for fatigue and weight loss Respiratory: negative for cough and wheezing Cardiovascular: negative for chest pain, fatigue and palpitations Gastrointestinal: negative for abdominal pain and change in bowel habits Genitourinary: positive for vaginal discharge and heavy, irregular periods Integument/breast: negative for nipple discharge Musculoskeletal:negative for myalgias Neurological: negative for gait problems and tremors Behavioral/Psych: negative for abusive relationship, depression Endocrine: negative for temperature intolerance      Blood pressure 101/63, pulse (!) 58, height 5' 1 (1.549 m), weight 101 lb 6.4 oz (46 kg).  Physical Exam Physical Exam General:   Alert and no distress  Skin:   no rash or abnormalities  Lungs:   clear to auscultation bilaterally  Heart:    regular rate and rhythm, S1, S2 normal, no murmur, click, rub or gallop  Breasts:   Not examined  Abdomen:  normal findings: no organomegaly, soft, non-tender and no hernia  Pelvis:  External genitalia: normal general appearance Urinary system: urethral meatus normal and bladder without fullness, nontender Vaginal: normal without tenderness, induration or masses Cervix: normal appearance Adnexa: normal bimanual exam Uterus: anteverted and non-tender, normal size    I have spent a total of 20 minutes of face-to-face time, excluding clinical staff time, reviewing notes and preparing to see patient, ordering tests and/or medications, and counseling the patient.   Data Reviewed Wet prep and cultures Labs  ULTRASOUND  US  PELVIC COMPLETE WITH TRANSVAGINAL (Accession [id number]) (Order [id number]) Imaging Date: 06/14/2024 Department: [facility]-ULTRASOUND Released By: Coni Alphonza KIDD Authorizing: Letha Renshaw, CNM   Exam Status  Status  Final [99]   PACS Intelerad Image Link   Show images for US  PELVIC COMPLETE WITH TRANSVAGINAL Study Result  Narrative & Impression  EXAM: US  Pelvis, Complete Transvaginal and Transabdominal without Doppler   TECHNIQUE: Transabdominal and transvaginal pelvic duplex ultrasound using B-mode/gray scaled imaging without Doppler spectral analysis and color flow was obtained.   COMPARISON: US  Pelvis 12/13/2023   CLINICAL HISTORY: Amenorrhea and ovarian cysts.   FINDINGS:   UTERUS: Uterus is anteverted and measures 8.2 x 4.4 x 4.8 cm. Uterus demonstrates normal myometrial echotexture.   ENDOMETRIAL STRIPE: Endometrial stripe measures 13.4 mm, and is mildly heterogeneous but without focal abnormal color flow.   RIGHT OVARY: Right ovary measures 3.6 x 2.5  x 2.3 cm, 10.7 cm. Both ovaries demonstrate normal vascularity on color flow Doppler examination.   Both ovaries demonstrate greater than 10 visible follicles per  section, with peripheral location of follicles.   The findings are compatible with polycystic ovarian morphology and noted on the prior study.   LEFT OVARY: Left ovary measures 4.0 x 3.0 x 2.8 cm, volume 17.6 cm. Both ovaries demonstrate normal vascularity on color flow Doppler examination.   Both ovaries demonstrate greater than 10 visible follicles per section, with peripheral location of follicles.   The findings are compatible with polycystic ovarian morphology and noted on the prior study.   FREE FLUID: There is trace anechoic pelvic cul-de-sac fluid extending into the dorsal adnexa.   IMPRESSION: 1. Polycystic ovarian morphology. No evidence of ovarian torsion or mass. 2. Mildly heterogeneous endometrial stripe measuring 13.4 mm, without focal abnormal color flow. 3. Minimal anechoic free fluid.   Electronically signed by: Francis Quam MD 07/01/2024 09:34 PM EST RP Workstation: [id number]        Assessment     1. PCOS (polycystic ovarian syndrome) (Primary) Rx: - metFORMIN  (GLUCOPHAGE -XR) 500 MG 24 hr tablet; Take 3 tablets (1,500 mg total) by mouth daily after supper.  Dispense: 90 tablet; Refill: 11 - Testosterone, Free, Total, SHBG; Future - TSH; Future - Comprehensive metabolic panel with GFR; Future - drospirenone -ethinyl estradiol  (YAZ) 3-0.02 MG tablet; Take 1 tablet by mouth daily.  Dispense: 28 tablet; Refill: 11  2. Female hirsutism Rx: - metFORMIN  (GLUCOPHAGE -XR) 500 MG 24 hr tablet; Take 3 tablets (1,500 mg total) by mouth daily after supper.  Dispense: 90 tablet; Refill: 11 - Testosterone, Free, Total, SHBG; Future  3. Menorrhagia with irregular cycle Rx: - CBC; Future - TSH; Future - Ferritin; Future - drospirenone -ethinyl estradiol  (YAZ) 3-0.02 MG tablet; Take 1 tablet by mouth daily.  Dispense: 28 tablet; Refill: 11  4. Vaginal discharge Rx: - Cervicovaginal ancillary only; Future  5. History of GC / Chlamydia infections, treated in  July 2025 - no GC/CT TOC probes done post-treatment - will do TOC GC/CT probes today  6. Screening for diabetes mellitus Rx: - Hemoglobin A1c; Future  7. Encounter for other general counseling or advice on contraception - options discussed with PCOS and hirsutism history, needing a low androgenic pill ideally  8. Encounter for initial prescription of contraceptive pills Rx: - drospirenone -ethinyl estradiol  (YAZ) 3-0.02 MG tablet; Take 1 tablet by mouth daily.  Dispense: 28 tablet; Refill: 11  9. Anxiety about health - clinically stable     Plan   Follow up in 6 months  Orders Placed This Encounter  Procedures   Hemoglobin A1c    Standing Status:   Future    Expiration Date:   07/16/2025   Testosterone, Free, Total, SHBG    Standing Status:   Future    Expiration Date:   07/16/2025   CBC    Standing Status:   Future    Expiration Date:   07/16/2025   TSH    Standing Status:   Future    Expiration Date:   07/16/2025   Ferritin    Standing Status:   Future    Expiration Date:   07/16/2025   Comprehensive metabolic panel with GFR    Standing Status:   Future    Expiration Date:   07/16/2025   Meds ordered this encounter  Medications   metFORMIN  (GLUCOPHAGE -XR) 500 MG 24 hr tablet    Sig: Take 3 tablets (1,500 mg total) by  mouth daily after supper.    Dispense:  90 tablet    Refill:  11   drospirenone -ethinyl estradiol  (YAZ) 3-0.02 MG tablet    Sig: Take 1 tablet by mouth daily.    Dispense:  28 tablet    Refill:  11     CARLIN RONAL CENTERS, MD, FACOG Attending Obstetrician & Gynecologist, [facility] for [facility], [facility], [facility] 07/16/2024    [1]  Social History Tobacco Use   Smoking status: Never    Passive exposure: Yes   Smokeless tobacco: Never  Vaping Use   Vaping status: Never Used  Substance Use Topics   Alcohol use: Yes    Comment: socially   Drug use: Yes    Types: Marijuana  [2] No Known Allergies

## 2024-07-25 ENCOUNTER — Ambulatory Visit (INDEPENDENT_AMBULATORY_CARE_PROVIDER_SITE_OTHER): Payer: Self-pay

## 2024-07-25 ENCOUNTER — Other Ambulatory Visit (HOSPITAL_COMMUNITY)
Admission: RE | Admit: 2024-07-25 | Discharge: 2024-07-25 | Disposition: A | Discharge status: Home / Self Care | Source: Ambulatory Visit | Attending: Obstetrics & Gynecology | Admitting: Obstetrics & Gynecology

## 2024-07-25 DIAGNOSIS — Z131 Encounter for screening for diabetes mellitus: Secondary | ICD-10-CM | POA: Insufficient documentation

## 2024-07-25 DIAGNOSIS — N921 Excessive and frequent menstruation with irregular cycle: Secondary | ICD-10-CM

## 2024-07-25 DIAGNOSIS — N898 Other specified noninflammatory disorders of vagina: Secondary | ICD-10-CM | POA: Insufficient documentation

## 2024-07-25 DIAGNOSIS — E282 Polycystic ovarian syndrome: Secondary | ICD-10-CM

## 2024-07-25 DIAGNOSIS — L680 Hirsutism: Secondary | ICD-10-CM

## 2024-07-25 NOTE — Progress Notes (Signed)
 SUBJECTIVE:  24 y.o. female following up for labs ordered by Dr. Rudy Denies abnormal vaginal discharge, bleeding or significant pelvic pain. No UTI symptoms. Denies history of known exposure to STD.  Patient's last menstrual period was 07/09/2024 (exact date).  OBJECTIVE:  She appears well.   ASSESSMENT:  Blood work Self-swab aptima  PLAN:  Pt offered STI blood screening-declined Aptima probe sent to lab.  Treatment: To be determined once lab results are received.  Pt follow up as needed.

## 2024-07-28 LAB — COMPREHENSIVE METABOLIC PANEL WITH GFR
ALT: 12 [IU]/L (ref 0–32)
AST: 17 [IU]/L (ref 0–40)
Albumin: 4.6 g/dL (ref 4.0–5.0)
Alkaline Phosphatase: 64 [IU]/L (ref 41–116)
BUN/Creatinine Ratio: 17 (ref 9–23)
BUN: 13 mg/dL (ref 6–20)
Bilirubin Total: <0.2 mg/dL (ref 0.0–1.2)
CO2: 21 mmol/L (ref 20–29)
Calcium: 9.4 mg/dL (ref 8.7–10.2)
Chloride: 103 mmol/L (ref 96–106)
Creatinine, Ser: 0.76 mg/dL (ref 0.57–1.00)
Globulin, Total: 2.7 g/dL (ref 1.5–4.5)
Glucose: 86 mg/dL (ref 70–99)
Potassium: 4 mmol/L (ref 3.5–5.2)
Sodium: 138 mmol/L (ref 134–144)
Total Protein: 7.3 g/dL (ref 6.0–8.5)
eGFR: 114 mL/min/{1.73_m2}

## 2024-07-28 LAB — TESTOSTERONE, FREE, TOTAL, SHBG
Sex Hormone Binding: 245 nmol/L — ABNORMAL HIGH (ref 24.6–122.0)
Testosterone, Free: 1 pg/mL (ref 0.0–4.2)
Testosterone: 72 ng/dL — ABNORMAL HIGH (ref 13–71)

## 2024-07-28 LAB — CBC
Hematocrit: 36.4 % (ref 34.0–46.6)
Hemoglobin: 12.1 g/dL (ref 11.1–15.9)
MCH: 30.6 pg (ref 26.6–33.0)
MCHC: 33.2 g/dL (ref 31.5–35.7)
MCV: 92 fL (ref 79–97)
Platelets: 263 10*3/uL (ref 150–450)
RBC: 3.96 x10E6/uL (ref 3.77–5.28)
RDW: 13.6 % (ref 11.7–15.4)
WBC: 5.1 10*3/uL (ref 3.4–10.8)

## 2024-07-28 LAB — TSH: TSH: 1.45 u[IU]/mL (ref 0.450–4.500)

## 2024-07-28 LAB — HEMOGLOBIN A1C
Est. average glucose Bld gHb Est-mCnc: 103 mg/dL
Hgb A1c MFr Bld: 5.2 % (ref 4.8–5.6)

## 2024-07-28 LAB — FERRITIN: Ferritin: 11 ng/mL — ABNORMAL LOW (ref 15–150)

## 2024-07-29 ENCOUNTER — Ambulatory Visit (HOSPITAL_COMMUNITY): Payer: Self-pay

## 2024-07-30 LAB — CERVICOVAGINAL ANCILLARY ONLY
Bacterial Vaginitis (gardnerella): POSITIVE — AB
Candida Glabrata: NEGATIVE
Candida Vaginitis: NEGATIVE
Chlamydia: NEGATIVE
Comment: NEGATIVE
Comment: NEGATIVE
Comment: NEGATIVE
Comment: NEGATIVE
Comment: NEGATIVE
Comment: NORMAL
Neisseria Gonorrhea: NEGATIVE
Trichomonas: NEGATIVE

## 2024-07-31 ENCOUNTER — Encounter (HOSPITAL_COMMUNITY): Payer: Self-pay

## 2024-07-31 ENCOUNTER — Ambulatory Visit (HOSPITAL_COMMUNITY)
Admission: EM | Admit: 2024-07-31 | Discharge: 2024-07-31 | Disposition: A | Discharge status: Home / Self Care | Source: Home / Self Care

## 2024-07-31 DIAGNOSIS — K029 Dental caries, unspecified: Secondary | ICD-10-CM | POA: Diagnosis not present

## 2024-07-31 DIAGNOSIS — S025XXA Fracture of tooth (traumatic), initial encounter for closed fracture: Secondary | ICD-10-CM

## 2024-07-31 DIAGNOSIS — K0889 Other specified disorders of teeth and supporting structures: Secondary | ICD-10-CM

## 2024-07-31 HISTORY — DX: Type 2 diabetes mellitus without complications: E11.9

## 2024-07-31 MED ORDER — OXYCODONE HCL 5 MG PO TABS: 5.0000 mg | ORAL_TABLET | Freq: Four times a day (QID) | ORAL | 0 refills | Status: DC | PRN

## 2024-07-31 MED ORDER — AMOXICILLIN 875 MG PO TABS: 875.0000 mg | ORAL_TABLET | ORAL | 0 refills | Status: AC

## 2024-07-31 MED ORDER — IBUPROFEN 800 MG PO TABS: 800.0000 mg | ORAL_TABLET | Freq: Three times a day (TID) | ORAL | 0 refills | Status: AC | PRN

## 2024-07-31 MED ORDER — CHLORHEXIDINE GLUCONATE 0.12 % MT SOLN: 15.0000 mL | OROMUCOSAL | 0 refills | Status: AC

## 2024-07-31 NOTE — Discharge Instructions (Signed)
 You were seen today for dental pain. It is very important that you schedule an appointment with a dentist as soon as possible for complete treatment. You have been given information for several clinics that accept Medicaid or offer affordable options--please call whichever can see you the soonest.  Take the prescribed antibiotic exactly as directed to help treat and prevent infection. Use the prescribed mouth rinse twice daily after brushing to keep the area clean; do not rinse, eat, drink, or smoke for at least 30 minutes afterward. After meals, rinse your mouth with water to reduce irritation.  For pain and inflammation, take the prescribed medication as directed. While taking this medication, do not use over-the-counter anti-inflammatories such as aspirin, Motrin , ibuprofen , or Aleve , as this may increase the risk of side effects. If needed, you may take Tylenol  (acetaminophen ) 1000 mg every six hours for additional pain relief. This equals two 500 mg tablets at a time. Be careful not to take more than 4000 mg of Tylenol  in a 24-hour period. A few tablets of very strong pain medication has been prescribed for severe pain only. Take this only if your pain is not relieved with the other non-narcotic medications. Do not drink alcohol, drive or participate in activities that require concentration/alertness while taking the narcotic pain medication.  Avoid foods or drinks that worsen your pain. Stick to soft foods, avoid sticky, hard, or crunchy foods, and do not use straws while your mouth is healing.

## 2024-08-01 ENCOUNTER — Telehealth (HOSPITAL_COMMUNITY): Payer: Self-pay

## 2024-08-01 MED ORDER — OXYCODONE HCL 5 MG PO TABS: 5.0000 mg | ORAL_TABLET | Freq: Four times a day (QID) | ORAL | 0 refills | Status: DC | PRN

## 2024-08-01 NOTE — Telephone Encounter (Signed)
 Apparent problem with oxycodone  Rx going through last night. I have resent.  Meds ordered this encounter  Medications   oxyCODONE  (ROXICODONE ) 5 MG immediate release tablet    Sig: Take 1 tablet (5 mg total) by mouth every 6 (six) hours as needed for severe pain (pain score 7-10).    Dispense:  6 tablet    Refill:  0

## 2024-08-02 ENCOUNTER — Telehealth (HOSPITAL_COMMUNITY): Payer: Self-pay

## 2024-08-02 NOTE — Telephone Encounter (Signed)
 She is also prescribed ibuprofen  800 mg at the same visit, and I would use that without added narcotic. She can add on tylenol  500 mg 2 every 6 hours as needed for pain

## 2024-08-02 NOTE — Telephone Encounter (Signed)
 Pt called back stating pharmacy received the oxycodone  script sent in on 08/01/24 but sent it back for pre-auth.  Is there an alternative we can try? Thanks.
